# Patient Record
Sex: Female | Born: 2000 | Race: Black or African American | Hispanic: No | Marital: Single
Health system: Midwestern US, Community
[De-identification: ages and names within clinical notes are randomized; demographics above are authoritative.]

## PROBLEM LIST (undated history)

## (undated) DIAGNOSIS — O039 Complete or unspecified spontaneous abortion without complication: Secondary | ICD-10-CM

## (undated) HISTORY — DX: Complete or unspecified spontaneous abortion without complication: O03.9

## (undated) HISTORY — PX: MOUTH SURGERY: SHX715

---
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
## 2018-07-17 ENCOUNTER — Inpatient Hospital Stay
Admit: 2018-07-17 | Discharge: 2018-07-17 | Disposition: A | Discharge status: Home / Self Care | Payer: Self-pay | Attending: Emergency Medicine

## 2018-07-17 DIAGNOSIS — N760 Acute vaginitis: Secondary | ICD-10-CM

## 2018-07-17 LAB — URINALYSIS W/ RFLX MICROSCOPIC
Bilirubin, Urine: NEGATIVE
Bilirubin: NEGATIVE
Blood, Urine: NEGATIVE
Blood: NEGATIVE
Glucose, Ur: NEGATIVE mg/dL
Glucose: NEGATIVE mg/dL
Nitrite, Urine: NEGATIVE
Nitrites: NEGATIVE
Protein, UA: NEGATIVE mg/dL
Protein: NEGATIVE mg/dL
Specific Gravity, UA: 1.03 (ref 1.005–1.030)
Specific gravity: 1.03 (ref 1.005–1.030)
Urobilinogen, UA, POCT: 1 EU/dL (ref 0.2–1.0)
Urobilinogen: 1 EU/dL (ref 0.2–1.0)
pH (UA): 8 (ref 5.0–8.0)
pH, UA: 8 (ref 5.0–8.0)

## 2018-07-17 LAB — HCG URINE, QL
HCG urine, QL: NEGATIVE
Pregnancy Test(Urn): NEGATIVE

## 2018-07-17 LAB — WET PREP
Wet Prep: NONE SEEN
Wet Prep: NONE SEEN
Wet prep: NONE SEEN
Wet prep: NONE SEEN

## 2018-07-17 LAB — URINE MICROSCOPIC ONLY
BACTERIA, URINE: NEGATIVE /hpf
Bacteria: NEGATIVE /hpf
RBC, UA: NEGATIVE /hpf (ref 0–5)
RBC: NEGATIVE /hpf (ref 0–5)
WBC, UA: 0 /hpf (ref 0–4)
WBC: 0 /hpf (ref 0–4)

## 2018-07-17 MED ORDER — METRONIDAZOLE 500 MG TAB
500 mg | ORAL_TABLET | Freq: Two times a day (BID) | ORAL | 0 refills | Status: DC
Start: 2018-07-17 — End: 2018-07-17

## 2018-07-17 MED ORDER — METRONIDAZOLE 500 MG TAB
500 mg | ORAL_TABLET | Freq: Two times a day (BID) | ORAL | 0 refills | Status: AC
Start: 2018-07-17 — End: 2018-07-24

## 2018-07-17 NOTE — ED Provider Notes (Signed)
ED Provider Notes by Clayton BiblesProferes, Sunny A, PA-C at 07/17/18 1402                Author: Clayton BiblesProferes, Sunny A, PA-C  Service: Emergency Medicine  Author Type: Physician Assistant       Filed: 07/17/18 1457  Date of Service: 07/17/18 1402  Status: Attested           Editor: Proferes, Sheryn BisonSunny A, PA-C (Physician Assistant)  Cosigner: Reuel DerbyWentzel, Carl F, MD at 07/17/18 1505          Attestation signed by Reuel DerbyWentzel, Carl F, MD at 07/17/18 1505          I was personally available for consultation in the emergency department. I have reviewed the chart prior to the patient's discharge and agree with  the documentation [facility] by [facility], including the assessment, treatment plan, and disposition.                                    EMERGENCY DEPARTMENT HISTORY AND PHYSICAL EXAM      2:02 PM         Date: 07/17/2018   Patient Name: Anita Marsh        History of Presenting Illness          Chief Complaint       Patient presents with        ?  Other             vaginal odor              History Provided By: Patient and Patient's Mother      Chief Complaint: vaginal odor          Additional History (Context): Anita JordanChianti Lashway  is a 17 y.o. female with  No significant past medical history who presents with vaginal odor but no discharge.  Odor has been going on for the past week.  She is not having any pelvic pain.  She denies any dysuria or hematuria.   Last menstrual period was within the week.  No change in discharge.  No fevers.  She has been trying natural treatments to get rid of the odor such as baths with tea tree oil and tampon soaked in tea tree oil as well.      PCP: UNKNOWN              Past History        Past Medical History:   History reviewed. No pertinent past medical history.      Past Surgical History:   History reviewed. No pertinent surgical history.      Family History:   History reviewed. No pertinent family history.      Social History:     Social History          Tobacco Use         ?  Smoking status:  Former  Smoker       Substance Use Topics         ?  Alcohol use:  Yes             Comment: social         ?  Drug use:  Never           Allergies:   No Known Allergies           Review of Systems  Review of Systems    Constitutional: Negative for fever.    HENT: Negative for facial swelling.     Eyes: Negative for visual disturbance.    Respiratory: Negative for shortness of breath.     Cardiovascular: Negative for chest pain.    Gastrointestinal: Negative for abdominal pain.    Genitourinary: Negative for dysuria and pelvic pain.         Vaginal odor     Musculoskeletal: Negative for neck pain.    Skin: Negative for rash.    Neurological: Negative for dizziness.    Psychiatric/Behavioral: Negative for confusion.    All other systems reviewed and are negative.              Physical Exam        Visit Vitals      BP  107/61 (BP 1 Location: Left arm, BP Patient Position: Sitting)     Pulse  97     Temp  98.8 ??F (37.1 ??C)     Resp  14     Ht  5\' 9"  (1.753 m)     Wt  58.5 kg (129 lb)     LMP  07/13/2018     SpO2  98%        BMI  19.05 kg/m??              Physical Exam    Constitutional: She is oriented to person, place, and time. She appears well-developed and well-nourished. No distress.    HENT:    Head: Normocephalic and atraumatic.    Eyes: Conjunctivae are normal.    Neck: Normal range of motion.    Cardiovascular: Normal rate and regular rhythm.    Pulmonary/Chest: Effort normal.    Abdominal: She exhibits no distension. There is no tenderness.   Musculoskeletal: Normal range of motion.   Neurological: She is alert and oriented  to person, place, and time.    Skin: Skin is warm and dry. She is not diaphoretic.   Psychiatric: She has a normal mood and affect.    Nursing note and vitals reviewed.              Diagnostic Study Results        Labs -     Recent Results (from the past 12 hour(s))     WET PREP          Collection Time: 07/17/18  2:40 PM         Result  Value  Ref Range            Special Requests:  NO  SPECIAL REQUESTS          Wet prep  FEW   CLUE CELLS PRESENT             Wet prep  NO TRICHOMONAS SEEN          Wet prep  NO YEAST SEEN          URINALYSIS W/ RFLX MICROSCOPIC          Collection Time: 07/17/18  2:42 PM         Result  Value  Ref Range            Color  YELLOW          Appearance  CLEAR          Specific gravity  1.030  1.005 - 1.030         pH (UA)  8.0  5.0 - 8.0         Protein  NEGATIVE   NEG mg/dL       Glucose  NEGATIVE   NEG mg/dL       Ketone  TRACE (A)  NEG mg/dL       Bilirubin  NEGATIVE   NEG         Blood  NEGATIVE   NEG         Urobilinogen  1.0  0.2 - 1.0 EU/dL       Nitrites  NEGATIVE   NEG         Leukocyte Esterase  TRACE (A)  NEG         HCG URINE, QL          Collection Time: 07/17/18  2:42 PM         Result  Value  Ref Range            HCG urine, QL  NEGATIVE   NEG             Radiologic Studies -      No orders to display                Medical Decision Making     I am the first provider for this patient.      I reviewed the vital signs, available nursing notes, past medical history, past surgical history, family history and social history.      Vital Signs-Reviewed the patient's vital signs.         Records Reviewed: Nursing Notes  (Time of Review: 2:02 PM)      ED Course: Progress Notes, Reevaluation, and Consults:         Provider Notes (Medical Decision Making): MDM   Number of Diagnoses or Management Options   BV (bacterial vaginosis):    Diagnosis management comments: 17yo F c/o vaginal odor but no pain or discharge.  Patient is comfortable doing self swabs.  No pain to suggest PID.        2:57 PM   BV treated.  STI test pending.   Discussed treatment plan, return precautions, symptomatic relief, and expected time to improvement.  All questions answered. Patient is stable for discharge and outpatient management.                          Diagnosis        Clinical Impression:       1.  BV (bacterial vaginosis)            Disposition: Discharged           Follow-up  Information               Follow up With  Specialties  Details  Why  Contact Info              [facility]  Emergency Medicine  In 3 days  If symptoms do not improve, Immediately if symptoms worsen  [address] [place] [address]   [phone]                   Patient's Medications       Start Taking           METRONIDAZOLE (FLAGYL) 500 MG TABLET     Take 1 Tab by mouth two (2) times a day for 7 days.  Continue Taking          No medications on file       These Medications have changed          No medications on file       Stop Taking          No medications on file        _______________________________      Attestations:   Scribe Attestation      Sunny A Proferes, PA-C acting as a Neurosurgeon for and in the presence of [employer], PA-C       July 17, 2018 at 2:57 PM         Provider Attestation:       I personally performed the services described in the documentation, reviewed the documentation, as recorded by the scribe in my presence, and it accurately and completely records my words and actions.  July 17, 2018 at 2:57 PM - Barton Fanny, PA-C   _______________________________

## 2018-07-17 NOTE — ED Notes (Signed)
C/O vaginal odor x 1 week. Denies vaginal discharge or pain.

## 2018-07-17 NOTE — ED Notes (Signed)
Patient armband removed and shreddedI have reviewed discharge instructions with the patient.  The patient verbalized understanding. rx x 1 given;

## 2018-07-17 NOTE — ED Provider Notes (Signed)
EMERGENCY DEPARTMENT HISTORY AND PHYSICAL EXAM    2:02 PM      Date: 07/17/2018  Patient Name: Anita Marsh    History of Presenting Illness     Chief Complaint   Patient presents with   ??? Other     vaginal odor         History Provided By: Patient and Patient's Mother    Chief Complaint: vaginal odor       Additional History (Context): Anita JordanChianti Kampe is a 17 y.o. female with No significant past medical history who presents with vaginal odor but no discharge.  Odor has been going on for the past week.  She is not having any pelvic pain.  She denies any dysuria or hematuria.  Last menstrual period was within the week.  No change in discharge.  No fevers.  She has been trying natural treatments to get rid of the odor such as baths with tea tree oil and tampon soaked in tea tree oil as well.    PCP: UNKNOWN        Past History     Past Medical History:  History reviewed. No pertinent past medical history.    Past Surgical History:  History reviewed. No pertinent surgical history.    Family History:  History reviewed. No pertinent family history.    Social History:  Social History     Tobacco Use   ??? Smoking status: Former Smoker   Substance Use Topics   ??? Alcohol use: Yes     Comment: social   ??? Drug use: Never       Allergies:  No Known Allergies      Review of Systems       Review of Systems   Constitutional: Negative for fever.   HENT: Negative for facial swelling.    Eyes: Negative for visual disturbance.   Respiratory: Negative for shortness of breath.    Cardiovascular: Negative for chest pain.   Gastrointestinal: Negative for abdominal pain.   Genitourinary: Negative for dysuria and pelvic pain.        Vaginal odor    Musculoskeletal: Negative for neck pain.   Skin: Negative for rash.   Neurological: Negative for dizziness.   Psychiatric/Behavioral: Negative for confusion.   All other systems reviewed and are negative.        Physical Exam     Visit Vitals   BP 107/61 (BP 1 Location: Left arm, BP Patient Position: Sitting)   Pulse 97   Temp 98.8 ??F (37.1 ??C)   Resp 14   Ht 5\' 9"  (1.753 m)   Wt 58.5 kg (129 lb)   LMP 07/13/2018   SpO2 98%   BMI 19.05 kg/m??         Physical Exam   Constitutional: She is oriented to person, place, and time. She appears well-developed and well-nourished. No distress.   HENT:   Head: Normocephalic and atraumatic.   Eyes: Conjunctivae are normal.   Neck: Normal range of motion.   Cardiovascular: Normal rate and regular rhythm.   Pulmonary/Chest: Effort normal.   Abdominal: She exhibits no distension. There is no tenderness.   Musculoskeletal: Normal range of motion.   Neurological: She is alert and oriented to person, place, and time.   Skin: Skin is warm and dry. She is not diaphoretic.   Psychiatric: She has a normal mood and affect.   Nursing note and vitals reviewed.        Diagnostic Study Results  Labs -  Recent Results (from the past 12 hour(s))   WET PREP    Collection Time: 07/17/18  2:40 PM   Result Value Ref Range    Special Requests: NO SPECIAL REQUESTS      Wet prep FEW  CLUE CELLS PRESENT        Wet prep NO TRICHOMONAS SEEN      Wet prep NO YEAST SEEN     URINALYSIS W/ RFLX MICROSCOPIC    Collection Time: 07/17/18  2:42 PM   Result Value Ref Range    Color YELLOW      Appearance CLEAR      Specific gravity 1.030 1.005 - 1.030      pH (UA) 8.0 5.0 - 8.0      Protein NEGATIVE  NEG mg/dL    Glucose NEGATIVE  NEG mg/dL    Ketone TRACE (A) NEG mg/dL    Bilirubin NEGATIVE  NEG      Blood NEGATIVE  NEG      Urobilinogen 1.0 0.2 - 1.0 EU/dL    Nitrites NEGATIVE  NEG      Leukocyte Esterase TRACE (A) NEG     HCG URINE, QL    Collection Time: 07/17/18  2:42 PM   Result Value Ref Range    HCG urine, QL NEGATIVE  NEG         Radiologic Studies -   No orders to display         Medical Decision Making   I am the first provider for this patient.    I reviewed the vital signs, available nursing notes, past medical history,  past surgical history, family history and social history.    Vital Signs-Reviewed the patient's vital signs.      Records Reviewed: Nursing Notes (Time of Review: 2:02 PM)    ED Course: Progress Notes, Reevaluation, and Consults:      Provider Notes (Medical Decision Making): MDM  Number of Diagnoses or Management Options  BV (bacterial vaginosis):   Diagnosis management comments: 17yo F c/o vaginal odor but no pain or discharge.  Patient is comfortable doing self swabs.  No pain to suggest PID.      2:57 PM  BV treated.  STI test pending.   Discussed treatment plan, return precautions, symptomatic relief, and expected time to improvement.  All questions answered. Patient is stable for discharge and outpatient management.                 Diagnosis     Clinical Impression:   1. BV (bacterial vaginosis)        Disposition: Discharged      Follow-up Information     Follow up With Specialties Details Why Contact Info    [facility] Emergency Medicine In 3 days If symptoms do not improve, Immediately if symptoms worsen [address] [place] [address]  [phone]           Patient's Medications   Start Taking    METRONIDAZOLE (FLAGYL) 500 MG TABLET    Take 1 Tab by mouth two (2) times a day for 7 days.   Continue Taking    No medications on file   These Medications have changed    No medications on file   Stop Taking    No medications on file     _______________________________    Attestations:  Scribe Attestation     Arnoldo Hildreth A Aahana Elza, PA-C acting as a Neurosurgeon for  and in the presence of Barton FannySunny Brantley Naser, PA-C      July 17, 2018 at 2:57 PM       Provider Attestation:      I personally performed the services described in the documentation, reviewed the documentation, as recorded by the scribe in my presence, and it accurately and completely records my words and actions. July 17, 2018 at 2:57 PM - Barton FannySunny Jeramey Lanuza, PA-C  _______________________________

## 2018-07-17 NOTE — ED Triage Notes (Signed)
C/O vaginal odor x 1 week. Denies vaginal discharge or pain.

## 2018-07-19 LAB — CHLAMYDIA/NEISSERIA AMPLIFICATION
Chlamydia amplification: POSITIVE — AB
N. gonorrhoeae amplification: NEGATIVE

## 2018-07-19 LAB — C.TRACHOMATIS N.GONORRHOEAE DNA
Chlamydia trachomatis, NAA: POSITIVE — AB
Neisseria Gonorrhoeae, NAA: NEGATIVE

## 2019-07-03 ENCOUNTER — Other Ambulatory Visit: Payer: Self-pay

## 2019-07-03 ENCOUNTER — Ambulatory Visit: Payer: Medicaid Other | Admitting: Nurse Practitioner

## 2019-07-03 VITALS — BP 96/59 | Ht 68.5 in | Wt 128.2 lb

## 2019-07-03 DIAGNOSIS — Z113 Encounter for screening for infections with a predominantly sexual mode of transmission: Secondary | ICD-10-CM | POA: Diagnosis not present

## 2019-07-03 LAB — WET PREP FOR TRICH, YEAST, CLUE
Trichomonas Exam: NEGATIVE
Yeast Exam: NEGATIVE

## 2019-07-03 NOTE — Progress Notes (Signed)
     STI clinic/screening visit  Subjective:  Stephany Poorman is a 18 y.o. female being seen today for an STI screening visit. The patient reports they do not have symptoms.  Patient has the following medical conditions:  There are no active problems to display for this patient.    Chief Complaint  Patient presents with  . Gynecologic Exam  . SEXUALLY TRANSMITTED DISEASE    all testing denies symptoms    Client into clinic for STD testing Denies any symptoms at this time Admits to Chlamydia in 2016 Never tested for HIV Last sex - 01/2019 - sometimes condom use Admits to Dental surgery in 04/2011 Pap due 11/2019 LMP - 06/02/2019 Denies current BCM - declines BCM at this time Denies any significant medical history  Patient reports - no symptoms at this time See flowsheet for further details and programmatic requirements.    The following portions of the patient's history were reviewed and updated as appropriate: allergies, current medications, past medical history, past social history, past surgical history and problem list.  Objective:   Vitals:   07/03/19 1008  BP: (!) 96/59  Weight: 128 lb 3.2 oz (58.2 kg)  Height: 5' 8.5" (1.74 m)    Physical Exam Vitals signs reviewed.  Constitutional:      Appearance: Normal appearance. She is well-developed and normal weight.  Pulmonary:     Effort: Pulmonary effort is normal.  Skin:    General: Skin is warm and dry.  Neurological:     Mental Status: She is alert.  Psychiatric:        Behavior: Behavior is cooperative.       Assessment and Plan:  Kyndal Heringer is a 18 y.o. female presenting to the [facility] Department for STI screening  1. Screening examination for STD (sexually transmitted disease) Await tests results  - WET PREP FOR [place], YEAST, CLUE - treat wet mount per standing order - Chlamydia/Gonococcus/Trichomonas, NAA  - HIV [facility] - Syphilis Serology, [facility]  Client verbalizes  understanding and is in agreement with plan of care    Return in about 6 months (around 12/31/2019) for Yearly physical exam to include pap.  No future appointments.  Berniece Andreas, NP

## 2019-07-03 NOTE — Progress Notes (Signed)
Here today for FP appt. Not interested in birth control at this time. Wants STD testing. Declines bloodwork. Hal Morales, RN  Wet Prep results reviewed. Per standing orders no treatment indicated. Hal Morales, RN

## 2019-07-17 ENCOUNTER — Other Ambulatory Visit: Payer: Self-pay | Admitting: Family Medicine

## 2019-07-17 NOTE — Telephone Encounter (Signed)
patient wants a refil on a pill that is for yeast infections. Says she does not remember the name but was told about it the day of her visit.

## 2019-07-17 NOTE — Telephone Encounter (Signed)
Returned patient phone call. Patient states she would like a "pill for yeast." Patient states when she was here last the provider told her to expect a call about her TR and can get a pill then if needed. Patient states "where I'm from in [place] they just give me a pill to keep the yeast down." RN counseled patient that wet prep results from  07/03/2019 STD appt test results were negative. Patient requesting to schedule another appt to be tested for yeast. STD appt scheduled for 07/23/2019 @ 3:20. Instructed patient to arrive at 3:00 for check in. Hal Morales, RN

## 2019-07-23 ENCOUNTER — Ambulatory Visit: Payer: Medicaid Other

## 2019-07-25 ENCOUNTER — Ambulatory Visit: Payer: Medicaid Other

## 2019-10-23 ENCOUNTER — Ambulatory Visit (LOCAL_COMMUNITY_HEALTH_CENTER): Payer: Medicaid Other | Admitting: Family Medicine

## 2019-10-23 ENCOUNTER — Encounter: Payer: Self-pay | Admitting: Family Medicine

## 2019-10-23 ENCOUNTER — Other Ambulatory Visit: Payer: Self-pay

## 2019-10-23 VITALS — BP 103/73 | Ht 69.0 in | Wt 121.0 lb

## 2019-10-23 DIAGNOSIS — N939 Abnormal uterine and vaginal bleeding, unspecified: Secondary | ICD-10-CM | POA: Diagnosis not present

## 2019-10-23 DIAGNOSIS — Z3009 Encounter for other general counseling and advice on contraception: Secondary | ICD-10-CM | POA: Diagnosis not present

## 2019-10-23 LAB — PREGNANCY, URINE: Preg Test, Ur: NEGATIVE

## 2019-10-23 NOTE — Progress Notes (Signed)
Here for "check up".  Declines HIV/RPR and BC method.  Reports passed blood clots last week. Last Depo was in 2016. LMP unknown. Aileen Fass, RN

## 2019-10-23 NOTE — Progress Notes (Signed)
  Family Planning Visit- Repeat Yearly Visit  Subjective:  Allison Burke is a 18 y.o. being seen today for an well woman visit and to discuss family planning options. Her boyfriend is present in the room.  She is currently using none for pregnancy prevention. Patient reports she does if she or her partner wants a pregnancy in the next year. Patient  does not have a problem list on file.  Chief Complaint  Patient presents with  . "check up"    Patient reports 2 days ago (Sunday) she was in the shower, felt dizzy. She sat on the toilet and passed small blood clots from vagina and threw up food. Prior to this she had pizza and 1/2 bottle of wine. After this she rested, has been taking it slow, less appetite but feeling better. This morning she had another episode of 1 blood clot in toilet. Denies abdominal pain, no fever.   LMP around Sept 30, skipped October period. Has not taken home pregnancy test.  States she is trying to get pregnant.   Declines STI screening today.   Does the patient desire a pregnancy in the next year? ([facility] flowsheet) YES  See flowsheet for other program required questions.   Body mass index is 17.87 kg/m. - Patient is eligible for diabetes screening based on BMI and age >11?  no HA1C ordered? not applicable  Patient reports 1 of partners in last year. Desires STI screening?  No - declines today  Does the patient have a current or past history of drug use? No   No components found for: HCV]   Health Maintenance Due  Topic Date Due  . CHLAMYDIA SCREENING  12/28/2013  . HIV Screening  12/28/2013  . TETANUS/TDAP  12/28/2017  . INFLUENZA VACCINE  06/30/2019    ROS  The following portions of the patient's history were reviewed and updated as appropriate: allergies, current medications, past family history, past medical history, past social history, past surgical history and problem list. Problem list updated.  Objective:   Vitals:   10/23/19 1019  BP:  103/73  Weight: 121 lb (54.9 kg)  Height: 5\' 9"  (1.753 m)    Physical Exam  Gen: well appearing, NAD, converses easily HEENT: no scleral icterus Lung: Normal WOB Ext: warm well perfused, no edema  Pt declines more extensive physical exam.   Urine pregnancy test: negative   Assessment and Plan:  Rynn Markiewicz is a 18 y.o. female presenting to the [facility] Department for an initial well woman exam/family planning visit    1. Family planning services Pt is hoping to get pregnant, declines contraception today. Preconception counseling given today. Continue prenatal vitamins, exercise and healthy diet. Encouraged cessation of alcohol, tobacco, and drugs. Declines STI screening today.  2. Vaginal bleeding Urine preg negative today, she declines pelvic exam. We discussed this may be the start of her period. Advised that if symptoms worsen to heavy bleeding and/or abdominal pain to go to ER. She and her boyfriend are comfortable with this plan. Also given handout of local primary care clinics in case more extensive gynecology workup needed in future.  - Pregnancy, urine   Return if symptoms worsen or fail to improve.  No future appointments.  Kandee Keen, PA-C

## 2019-10-24 ENCOUNTER — Encounter: Payer: Medicaid Other | Admitting: Obstetrics & Gynecology

## 2019-12-11 ENCOUNTER — Other Ambulatory Visit: Payer: Self-pay

## 2019-12-11 ENCOUNTER — Ambulatory Visit (LOCAL_COMMUNITY_HEALTH_CENTER): Payer: Medicaid Other

## 2019-12-11 VITALS — BP 102/69 | Ht 69.0 in | Wt 121.0 lb

## 2019-12-11 DIAGNOSIS — Z3201 Encounter for pregnancy test, result positive: Secondary | ICD-10-CM | POA: Diagnosis not present

## 2019-12-11 MED ORDER — PRENATAL VITAMINS 28-0.8 MG PO TABS: 28.0000 mg | ORAL_TABLET | Freq: Every day | ORAL | 0 refills | Status: AC

## 2019-12-11 NOTE — Progress Notes (Signed)
SAB 4 months ago per patient.  Plans PNC at [facility]. No [facility].  Richmond Campbell, RN

## 2019-12-12 LAB — PREGNANCY, URINE: Preg Test, Ur: POSITIVE — AB

## 2019-12-20 ENCOUNTER — Other Ambulatory Visit: Payer: Self-pay

## 2019-12-20 ENCOUNTER — Other Ambulatory Visit (HOSPITAL_COMMUNITY)
Admission: RE | Admit: 2019-12-20 | Discharge: 2019-12-20 | Disposition: A | Discharge status: Home / Self Care | Payer: Medicaid Other | Source: Ambulatory Visit | Attending: Advanced Practice Midwife | Admitting: Advanced Practice Midwife

## 2019-12-20 ENCOUNTER — Ambulatory Visit (INDEPENDENT_AMBULATORY_CARE_PROVIDER_SITE_OTHER): Payer: Medicaid Other | Admitting: Advanced Practice Midwife

## 2019-12-20 ENCOUNTER — Encounter: Payer: Self-pay | Admitting: Advanced Practice Midwife

## 2019-12-20 VITALS — BP 114/70 | Wt 122.0 lb

## 2019-12-20 DIAGNOSIS — Z124 Encounter for screening for malignant neoplasm of cervix: Secondary | ICD-10-CM | POA: Diagnosis present

## 2019-12-20 DIAGNOSIS — Z113 Encounter for screening for infections with a predominantly sexual mode of transmission: Secondary | ICD-10-CM

## 2019-12-20 DIAGNOSIS — Z3481 Encounter for supervision of other normal pregnancy, first trimester: Secondary | ICD-10-CM

## 2019-12-20 DIAGNOSIS — Z3A08 8 weeks gestation of pregnancy: Secondary | ICD-10-CM

## 2019-12-20 DIAGNOSIS — Z3480 Encounter for supervision of other normal pregnancy, unspecified trimester: Secondary | ICD-10-CM | POA: Insufficient documentation

## 2019-12-20 NOTE — Patient Instructions (Signed)
Perinatal Depression When a woman feels excessive sadness, anger, or anxiety during pregnancy or during the first 12 months after she gives birth, she has a condition called perinatal depression. Depression can interfere with work, school, relationships, and other everyday activities. If it is not managed properly, it can also cause problems in the mother and her baby. Sometimes, perinatal depression is left untreated because symptoms are thought to be normal mood swings during and right after pregnancy. If you have symptoms of depression, it is important to talk with your health care provider. What are the causes? The exact cause of this condition is not known. Hormonal changes during and after pregnancy may play a role in causing perinatal depression. What increases the risk? You are more likely to develop this condition if:  You have a personal or family history of depression, anxiety, or mood disorders.  You experience a stressful life event during pregnancy, such as the death of a loved one.  You have a lot of regular life stress.  You do not have support from family members or loved ones, or you are in an abusive relationship. What are the signs or symptoms? Symptoms of this condition include:  Feeling sad or hopeless.  Feelings of guilt.  Feeling irritable or overwhelmed.  Changes in your appetite.  Lack of energy or motivation.  Sleep problems.  Difficulty concentrating or completing tasks.  Loss of interest in hobbies or relationships.  Headaches or stomach problems that do not go away. How is this diagnosed? This condition is diagnosed based on a physical exam and mental evaluation. In some cases, your health care provider may use a depression screening tool. These tools include a list of questions that can help a health care provider diagnose depression. Your health care provider may refer you to a mental health expert who specializes in depression. How is this  treated? This condition may be treated with:  Medicines. Your health care provider will only give you medicines that have been proven safe for pregnancy and breastfeeding.  Talk therapy with a mental health professional to help change your patterns of thinking (cognitive behavioral therapy).  Support groups.  Brain stimulation or light therapies.  Stress reduction therapies, such as mindfulness. Follow these instructions at home: Lifestyle  Do not use any products that contain nicotine or tobacco, such as cigarettes and e-cigarettes. If you need help quitting, ask your health care provider.  Do not use alcohol when you are pregnant. After your baby is born, limit alcohol intake to no more than 1 drink a day. One drink equals 12 oz of beer, 5 oz of wine, or 1 oz of hard liquor.  Consider joining a support group for new mothers. Ask your health care provider for recommendations.  Take good care of yourself. Make sure you: ? Get plenty of sleep. If you are having trouble sleeping, talk with your health care provider. ? Eat a healthy diet. This includes plenty of fruits and vegetables, whole grains, and lean proteins. ? Exercise regularly, as told by your health care provider. Ask your health care provider what exercises are safe for you. General instructions  Take over-the-counter and prescription medicines only as told by your health care provider.  Talk with your partner or family members about your feelings during pregnancy. Share any concerns or anxieties that you may have.  Ask for help with tasks or chores when you need it. Ask friends and family members to provide meals, watch your children, or help with   cleaning.  Keep all follow-up visits as told by your health care provider. This is important. Contact a health care provider if:  You (or people close to you) notice that you have any symptoms of depression.  You have depression and your symptoms get worse.  You  experience side effects from medicines, such as nausea or sleep problems. Get help right away if:  You feel like hurting yourself, your baby, or someone else. If you ever feel like you may hurt yourself or others, or have thoughts about taking your own life, get help right away. You can go to your nearest emergency department or call:  Your local emergency services (911 in the [place]  A suicide crisis helpline, such as the National Suicide Prevention Lifeline at 92804915731-(801)316-3749. This is open 24 hours a day. Summary  Perinatal depression is when a woman feels excessive sadness, anger, or anxiety during pregnancy or during the first 12 months after she gives birth.  If perinatal depression is not treated, it can lead to health problems for the mother and her baby.  This condition is treated with medicines, talk therapy, stress reduction therapies, or a combination of two or more treatments.  Talk with your partner or family members about your feelings. Do not be afraid to ask for help. This information is not intended to replace advice given to you by your health care provider. Make sure you discuss any questions you have with your health care provider. Document Revised: 05/02/2019 Document Reviewed: 01/12/2017 Elsevier Patient Education  2020 [employer] Genetic Testing During Pregnancy Genetic testing during pregnancy is also called prenatal genetic testing. This type of testing can determine if your baby is at risk of being born with a disorder caused by abnormal genes or chromosomes (genetic disorder). Chromosomes contain genes that control how your baby will develop in your womb. There are many different genetic disorders. Examples of genetic disorders that may be found through genetic testing include Down syndrome and cystic fibrosis. Gene changes (mutations) can be passed down through families. Genetic testing is offered to all women before or during pregnancy. You can choose whether to  have genetic testing. Why is genetic testing done? Genetic testing is done during pregnancy to find out whether your child is at risk for a genetic disorder. Having genetic testing allows you to:  Discuss your test results and options with a genetic counselor.  Prepare for a baby that may be born with a genetic disorder. Learning about the disorder ahead of time helps you be better prepared to manage it. Your health care providers can also be prepared in case your baby requires special care before or after birth.  Consider whether you want to continue with the pregnancy. In some cases, genetic testing may be done to learn about the traits a child will inherit. Types of genetic tests There are two basic types of genetic testing. Screening tests indicate whether your developing baby (fetus) is at higher risk for a genetic disorder. Diagnostic tests check actual fetal cells to diagnose a genetic disorder. Screening tests     Screening tests will not harm your baby. They are recommended for all pregnant women. Types of screening tests include:  Carrier screening. This test involves checking genes from both parents by testing their blood or saliva. The test checks to find out if the parents carry a genetic mutation that may be passed to a baby. In most cases, both parents must carry the mutation for a baby to be  at risk.  First trimester screening. This test combines a blood test with sound wave imaging of your baby (fetal ultrasound). This screening test checks for a risk of Down syndrome or other defects caused by having extra chromosomes. It also checks for defects of the heart, abdomen, or skeleton.  Second trimester screening also combines a blood test with a fetal ultrasound exam. It checks for a risk of genetic defects of the face, brain, spine, heart, or limbs.  Combined or sequential screening. This type of testing combines the results of first and second trimester screening. This type of  testing may be more accurate than first or second trimester screening alone.  Cell-free DNA testing. This is a blood test that detects cells released by the placenta that get into the mother's blood. It can be used to check for a risk of Down syndrome, other extra chromosome syndromes, and disorders caused by abnormal numbers of sex chromosomes. This test can be done any time after 10 weeks of pregnancy.  Diagnostic tests Diagnostic tests carry slight risks of problems, including bleeding, infection, and loss of the pregnancy. These tests are done only if your baby is at risk for a genetic disorder. You may meet with a genetic counselor to discuss the risks and benefits before having diagnostic tests. Examples of diagnostic tests include:  Chorionic villus sampling (CVS). This involves a procedure to remove and test a sample of cells taken from the placenta. The procedure may be done between 10 and 12 weeks of pregnancy.  Amniocentesis. This involves a procedure to remove and test a sample of fluid (amniotic fluid) and cells from the sac that surrounds the developing baby. The procedure may be done between 15 and 20 weeks of pregnancy. What do the results mean? For a screening test:  If the results are negative, it often means that your child is not at higher risk. There is still a slight chance your child could have a genetic disorder.  If the results are positive, it does not mean your child will have a genetic disorder. It may mean that your child has a higher-than-normal risk for a genetic disorder. In that case, you may want to talk with a genetic counselor about whether you should have diagnostic genetic tests. For a diagnostic test:  If the result is negative, it is unlikely that your child will have a genetic disorder.  If the test is positive for a genetic disorder, it is likely that your child will have the disorder. The test may not tell how severe the disorder will be. Talk with your  health care provider about your options. Questions to ask your health care provider Before talking to your health care provider about genetic testing, find out if there is a history of genetic disorders in your family. It may also help to know your family's ethnic origins. Then ask your health care provider the following questions:  Is my baby at risk for a genetic disorder?  What are the benefits of having genetic screening?  What tests are best for me and my baby?  What are the risks of each test?  If I get a positive result on a screening test, what is the next step?  Should I meet with a genetic counselor before having a diagnostic test?  Should my partner or other members of my family be tested?  How much do the tests cost? Will my insurance cover the testing? Summary  Genetic testing is done during pregnancy to  find out whether your child is at risk for a genetic disorder.  Genetic testing is offered to all women before or during pregnancy. You can choose whether to have genetic testing.  There are two basic types of genetic testing. Screening tests indicate whether your developing baby (fetus) is at higher risk for a genetic disorder. Diagnostic tests check actual fetal cells to diagnose a genetic disorder.  If a diagnostic genetic test is positive, talk with your health care provider about your options. This information is not intended to replace advice given to you by your health care provider. Make sure you discuss any questions you have with your health care provider. Document Revised: 03/08/2019 Document Reviewed: 01/30/2018 Elsevier Patient Education  2020 [employer]. Exercise During Pregnancy Exercise is an important part of being healthy for people of all ages. Exercise improves the function of your heart and lungs and helps you maintain strength, flexibility, and a healthy body weight. Exercise also boosts energy levels and elevates mood. Most women should exercise  regularly during pregnancy. In rare cases, women with certain medical conditions or complications may be asked to limit or avoid exercise during pregnancy. How does this affect me? Along with maintaining general strength and flexibility, exercising during pregnancy can help:  Keep strength in muscles that are used during labor and childbirth.  Decrease low back pain.  Reduce symptoms of depression.  Control weight gain during pregnancy.  Reduce the risk of needing insulin if you develop diabetes during pregnancy.  Decrease the risk of cesarean delivery.  Speed up your recovery after giving birth. How does this affect my baby? Exercise can help you have a healthy pregnancy. Exercise does not cause premature birth. It will not cause your baby to weigh less at birth. What exercises can I do? Many exercises are safe for you to do during pregnancy. Do a variety of exercises that safely increase your heart and breathing rates and help you build and maintain muscle strength. Do exercises exactly as told by your health care provider. You may do these exercises:  Walking or hiking.  Swimming.  Water aerobics.  Riding a stationary bike.  Strength training.  Modified yoga or Pilates. Tell your instructor that you are pregnant. Avoid overstretching, and avoid lying on your back for long periods of time.  Running or jogging. Only choose this type of exercise if you: ? Ran or jogged regularly before your pregnancy. ? Can run or jog and still talk in complete sentences. What exercises should I avoid? Depending on your level of fitness and whether you exercised regularly before your pregnancy, you may be told to limit high-intensity exercise. You can tell that you are exercising at a high intensity if you are breathing much harder and faster and cannot hold a conversation while exercising. You must avoid:  Contact sports.  Activities that put you at risk for falling on or being hit in the  belly, such as downhill skiing, water skiing, surfing, rock climbing, cycling, gymnastics, and horseback riding.  Scuba diving.  Skydiving.  Yoga or Pilates in a room that is heated to high temperatures.  Jogging or running, unless you ran or jogged regularly before your pregnancy. While jogging or running, you should always be able to talk in full sentences. Do not run or jog so fast that you are unable to have a conversation.  Do not exercise at more than 6,000 feet above sea level (high elevation) if you are not used to exercising at high  elevation. How do I exercise in a safe way?   Avoid overheating. Do not exercise in very high temperatures.  Wear loose-fitting, breathable clothes.  Avoid dehydration. Drink enough water before, during, and after exercise to keep your urine pale yellow.  Avoid overstretching. Because of hormone changes during pregnancy, it is easy to overstretch muscles, tendons, and ligaments during pregnancy.  Start slowly and ask your health care provider to recommend the types of exercise that are safe for you.  Do not exercise to lose weight. Follow these instructions at home:  Exercise on most days or all days of the week. Try to exercise for 30 minutes a day, 5 days a week, unless your health care provider tells you not to.  If you actively exercised before your pregnancy and you are healthy, your health care provider may tell you to continue to do moderate to high-intensity exercise.  If you are just starting to exercise or did not exercise much before your pregnancy, your health care provider may tell you to do low to moderate-intensity exercise. Questions to ask your health care provider  Is exercise safe for me?  What are signs that I should stop exercising?  Does my health condition mean that I should not exercise during pregnancy?  When should I avoid exercising during pregnancy? Stop exercising and contact a health care provider if: You  have any unusual symptoms, such as:  Mild contractions of the uterus or cramps in the abdomen.  Dizziness that does not go away when you rest. Stop exercising and get help right away if: You have any unusual symptoms, such as:  Sudden, severe pain in your low back or your belly.  Mild contractions of the uterus or cramps in the abdomen that do not improve with rest and drinking fluids.  Chest pain.  Bleeding or fluid leaking from your vagina.  Shortness of breath. These symptoms may represent a serious problem that is an emergency. Do not wait to see if the symptoms will go away. Get medical help right away. Call your local emergency services (911 in the [place] Do not drive yourself to the hospital. Summary  Most women should exercise regularly throughout pregnancy. In rare cases, women with certain medical conditions or complications may be asked to limit or avoid exercise during pregnancy.  Do not exercise to lose weight during pregnancy.  Your health care provider will tell you what level of physical activity is right for you.  Stop exercising and contact a health care provider if you have mild contractions of the uterus or cramps in the abdomen. Get help right away if these contractions or cramps do not improve with rest and drinking fluids.  Stop exercising and get help right away if you have sudden, severe pain in your low back or belly, chest pain, shortness of breath, or bleeding or leaking of fluid from your vagina. This information is not intended to replace advice given to you by your health care provider. Make sure you discuss any questions you have with your health care provider. Document Revised: 03/08/2019 Document Reviewed: 12/20/2018 Elsevier Patient Education  2020 [employer]. Eating Plan for Pregnant Women While you are pregnant, your body requires additional nutrition to help support your growing baby. You also have a higher need for some vitamins and minerals,  such as folic acid, calcium, iron, and vitamin D. Eating a healthy, well-balanced diet is very important for your health and your baby's health. Your need for extra calories varies for  the three 59-month segments of your pregnancy (trimesters). For most women, it is recommended to consume:  150 extra calories a day during the first trimester.  300 extra calories a day during the second trimester.  300 extra calories a day during the third trimester. What are tips for following this plan?   Do not try to lose weight or go on a diet during pregnancy.  Limit your overall intake of foods that have "empty calories." These are foods that have little nutritional value, such as sweets, desserts, candies, and sugar-sweetened beverages.  Eat a variety of foods (especially fruits and vegetables) to get a full range of vitamins and minerals.  Take a prenatal vitamin to help meet your additional vitamin and mineral needs during pregnancy, specifically for folic acid, iron, calcium, and vitamin D.  Remember to stay active. Ask your health care provider what types of exercise and activities are safe for you.  Practice good food safety and cleanliness. Wash your hands before you eat and after you prepare raw meat. Wash all fruits and vegetables well before peeling or eating. Taking these actions can help to prevent food-borne illnesses that can be very dangerous to your baby, such as listeriosis. Ask your health care provider for more information about listeriosis. What does 150 extra calories look like? Healthy options that provide 150 extra calories each day could be any of the following:  6-8 oz (170-230 g) of plain low-fat yogurt with  cup of berries.  1 apple with 2 teaspoons (11 g) of peanut butter.  Cut-up vegetables with  cup (60 g) of hummus.  8 oz (230 mL) or 1 cup of low-fat chocolate milk.  1 stick of string cheese with 1 medium orange.  1 peanut butter and jelly sandwich that is made  with one slice of whole-wheat bread and 1 tsp (5 g) of peanut butter. For 300 extra calories, you could eat two of those healthy options each day. What is a healthy amount of weight to gain? The right amount of weight gain for you is based on your BMI before you became pregnant. If your BMI:  Was less than 18 (underweight), you should gain 28-40 lb (13-18 kg).  Was 18-24.9 (normal), you should gain 25-35 lb (11-16 kg).  Was 25-29.9 (overweight), you should gain 15-25 lb (7-11 kg).  Was 30 or greater (obese), you should gain 11-20 lb (5-9 kg). What if I am having twins or multiples? Generally, if you are carrying twins or multiples:  You may need to eat 300-600 extra calories a day.  The recommended range for total weight gain is 25-54 lb (11-25 kg), depending on your BMI before pregnancy.  Talk with your health care provider to find out about nutritional needs, weight gain, and exercise that is right for you. What foods can I eat?  Fruits All fruits. Eat a variety of colors and types of fruit. Remember to wash your fruits well before peeling or eating. Vegetables All vegetables. Eat a variety of colors and types of vegetables. Remember to wash your vegetables well before peeling or eating. Grains All grains. Choose whole grains, such as whole-wheat bread, oatmeal, or brown rice. Meats and other protein foods Lean meats, including chicken, [place], fish, and lean cuts of beef, veal, or pork. If you eat fish or seafood, choose options that are higher in omega-3 fatty acids and lower in mercury, such as salmon, herring, mussels, trout, sardines, pollock, shrimp, crab, and lobster. Tofu. Tempeh. Beans. Eggs. Peanut  butter and other nut butters. Make sure that all meats, poultry, and eggs are cooked to food-safe temperatures or "well-done." Two or more servings of fish are recommended each week in order to get the most benefits from omega-3 fatty acids that are found in seafood. Choose fish  that are lower in mercury. You can find more information online:  PumpkinSearch.com.ee Dairy Pasteurized milk and milk alternatives (such as almond milk). Pasteurized yogurt and pasteurized cheese. Cottage cheese. Sour cream. Beverages Water. Juices that contain 100% fruit juice or vegetable juice. Caffeine-free teas and decaffeinated coffee. Drinks that contain caffeine are okay to drink, but it is better to avoid caffeine. Keep your total caffeine intake to less than 200 mg each day (which is 12 oz or 355 mL of coffee, tea, or soda) or the limit as told by your health care provider. Fats and oils Fats and oils are okay to include in moderation. Sweets and desserts Sweets and desserts are okay to include in moderation. Seasoning and other foods All pasteurized condiments. The items listed above may not be a complete list of foods and beverages you can eat. Contact a dietitian for more information. What foods are not recommended? Fruits Unpasteurized fruit juices. Vegetables Raw (unpasteurized) vegetable juices. Meats and other protein foods Lunch meats, bologna, hot dogs, or other deli meats. (If you must eat those meats, reheat them until they are steaming hot.) Refrigerated pat, meat spreads from a meat counter, smoked seafood that is found in the refrigerated section of a store. Raw or undercooked meats, poultry, and eggs. Raw fish, such as sushi or sashimi. Fish that have high mercury content, such as tilefish, shark, swordfish, and king mackerel. To learn more about mercury in fish, talk with your health care provider or look for online resources, such as:  PumpkinSearch.com.ee Dairy Raw (unpasteurized) milk and any foods that have raw milk in them. Soft cheeses, such as feta, queso blanco, queso fresco, Brie, Camembert cheeses, blue-veined cheeses, and Panela cheese (unless it is made with pasteurized milk, which must be stated on the label). Beverages Alcohol. Sugar-sweetened beverages, such as  sodas, teas, or energy drinks. Seasoning and other foods Homemade fermented foods and drinks, such as pickles, sauerkraut, or kombucha drinks. (Store-bought pasteurized versions of these are okay.) Salads that are made in a store or deli, such as ham salad, chicken salad, egg salad, tuna salad, and seafood salad. The items listed above may not be a complete list of foods and beverages you should avoid. Contact a dietitian for more information. Where to find more information To calculate the number of calories you need based on your height, weight, and activity level, you can use an online calculator such as:  PackageNews.is To calculate how much weight you should gain during pregnancy, you can use an online pregnancy weight gain calculator such as:  http://jones-berg.com/ Summary  While you are pregnant, your body requires additional nutrition to help support your growing baby.  Eat a variety of foods, especially fruits and vegetables to get a full range of vitamins and minerals.  Practice good food safety and cleanliness. Wash your hands before you eat and after you prepare raw meat. Wash all fruits and vegetables well before peeling or eating. Taking these actions can help to prevent food-borne illnesses, such as listeriosis, that can be very dangerous to your baby.  Do not eat raw meat or fish. Do not eat fish that have high mercury content, such as tilefish, shark, swordfish, and king mackerel. Do  not eat unpasteurized (raw) dairy.  Take a prenatal vitamin to help meet your additional vitamin and mineral needs during pregnancy, specifically for folic acid, iron, calcium, and vitamin D. This information is not intended to replace advice given to you by your health care provider. Make sure you discuss any questions you have with your health care provider. Document Revised: 04/05/2019 Document Reviewed: 08/12/2017 Elsevier Patient Education   2020 [employer]. Prenatal Care Prenatal care is health care during pregnancy. It helps you and your unborn baby (fetus) stay as healthy as possible. Prenatal care may be provided by a midwife, a family practice health care provider, or a childbirth and pregnancy specialist (obstetrician). How does this affect me? During pregnancy, you will be closely monitored for any new conditions that might develop. To lower your risk of pregnancy complications, you and your health care provider will talk about any underlying conditions you have. How does this affect my baby? Early and consistent prenatal care increases the chance that your baby will be healthy during pregnancy. Prenatal care lowers the risk that your baby will be:  Born early (prematurely).  Smaller than expected at birth (small for gestational age). What can I expect at the first prenatal care visit? Your first prenatal care visit will likely be the longest. You should schedule your first prenatal care visit as soon as you know that you are pregnant. Your first visit is a good time to talk about any questions or concerns you have about pregnancy. At your visit, you and your health care provider will talk about:  Your medical history, including: ? Any past pregnancies. ? Your family's medical history. ? The baby's father's medical history. ? Any long-term (chronic) health conditions you have and how you manage them. ? Any surgeries or procedures you have had. ? Any current over-the-counter or prescription medicines, herbs, or supplements you are taking.  Other factors that could pose a risk to your baby, including:  Your home setting and your stress levels, including: ? Exposure to abuse or violence. ? Household financial strain. ? Mental health conditions you have.  Your daily health habits, including diet and exercise. Your health care provider will also:  Measure your weight, height, and blood pressure.  Do a physical  exam, including a pelvic and breast exam.  Perform blood tests and urine tests to check for: ? Urinary tract infection. ? Sexually transmitted infections (STIs). ? Low iron levels in your blood (anemia). ? Blood type and certain proteins on red blood cells (Rh antibodies). ? Infections and immunity to viruses, such as hepatitis B and rubella. ? HIV (human immunodeficiency virus).  Do an ultrasound to confirm your baby's growth and development and to help predict your estimated due date (EDD). This ultrasound is done with a probe that is inserted into the vagina (transvaginal ultrasound).  Discuss your options for genetic screening.  Give you information about how to keep yourself and your baby healthy, including: ? Nutrition and taking vitamins. ? Physical activity. ? How to manage pregnancy symptoms such as nausea and vomiting (morning sickness). ? Infections and substances that may be harmful to your baby and how to avoid them. ? Food safety. ? Dental care. ? Working. ? Travel. ? Warning signs to watch for and when to call your health care provider. How often will I have prenatal care visits? After your first prenatal care visit, you will have regular visits throughout your pregnancy. The visit schedule is often as follows:  Up to  week 28 of pregnancy: once every 4 weeks.  28-36 weeks: once every 2 weeks.  After 36 weeks: every week until delivery. Some women may have visits more or less often depending on any underlying health conditions and the health of the baby. Keep all follow-up and prenatal care visits as told by your health care provider. This is important. What happens during routine prenatal care visits? Your health care provider will:  Measure your weight and blood pressure.  Check for fetal heart sounds.  Measure the height of your uterus in your abdomen (fundal height). This may be measured starting around week 20 of pregnancy.  Check the position of your  baby inside your uterus.  Ask questions about your diet, sleeping patterns, and whether you can feel the baby move.  Review warning signs to watch for and signs of labor.  Ask about any pregnancy symptoms you are having and how you are dealing with them. Symptoms may include: ? Headaches. ? Nausea and vomiting. ? Vaginal discharge. ? Swelling. ? Fatigue. ? Constipation. ? Any discomfort, including back or pelvic pain. Make a list of questions to ask your health care provider at your routine visits. What tests might I have during prenatal care visits? You may have blood, urine, and imaging tests throughout your pregnancy, such as:  Urine tests to check for glucose, protein, or signs of infection.  Glucose tests to check for a form of diabetes that can develop during pregnancy (gestational diabetes mellitus). This is usually done around week 24 of pregnancy.  An ultrasound to check your baby's growth and development and to check for birth defects. This is usually done around week 20 of pregnancy.  A test to check for group B strep (GBS) infection. This is usually done around week 36 of pregnancy.  Genetic testing. This may include blood or imaging tests, such as an ultrasound. Some genetic tests are done during the first trimester and some are done during the second trimester. What else can I expect during prenatal care visits? Your health care provider may recommend getting certain vaccines during pregnancy. These may include:  A yearly flu shot (annual influenza vaccine). This is especially important if you will be pregnant during flu season.  Tdap (tetanus, diphtheria, pertussis) vaccine. Getting this vaccine during pregnancy can protect your baby from whooping cough (pertussis) after birth. This vaccine may be recommended between weeks 27 and 36 of pregnancy. Later in your pregnancy, your health care provider may give you information about:  Childbirth and breastfeeding  classes.  Choosing a health care provider for your baby.  Umbilical cord banking.  Breastfeeding.  Birth control after your baby is born.  The hospital labor and delivery unit and how to tour it.  Registering at the hospital before you go into labor. Where to find more information  Office on Women's Health: LegalWarrants.gl  American Pregnancy Association: americanpregnancy.org  March of Dimes: marchofdimes.org Summary  Prenatal care helps you and your baby stay as healthy as possible during pregnancy.  Your first prenatal care visit will most likely be the longest.  You will have visits and tests throughout your pregnancy to monitor your health and your baby's health.  Bring a list of questions to your visits to ask your health care provider.  Make sure to keep all follow-up and prenatal care visits with your health care provider. This information is not intended to replace advice given to you by your health care provider. Make sure you discuss any questions  you have with your health care provider. Document Revised: 03/07/2019 Document Reviewed: 11/14/2017 Elsevier Patient Education  2020 [employer].    COVID-19 and Your Pregnancy FAQ  How can I prevent infection with COVID-19 during my pregnancy? Social distancing is key. Please limit any interactions in public. Try and work from home if possible. Frequently wash your hands after touching possibly contaminated surfaces. Avoid touching your face.  Minimize trips to the store. Consider online ordering when possible.   Should I wear a mask? YES. It is recommended by the CDC that all people wear a cloth mask or facial covering in public. You should wear a mask to your visits in the office. This will help reduce transmission as well as your risk or acquiring COVID-19. New studies are showing that even asymptomatic individuals can spread the virus from talking.   Where can I get a mask? [place] and the city of  [place] are partnering to provide masks to community members. You can pick up a mask from several locations. This website also has instructions about how to make a mask by sewing or without sewing by using a t-shirt or bandana.  https://www.[place].gov/i-want-to/learn-about/covid-19-information-and-updates/covid-19-face-mask-project  Studies have shown that if you were a tube or nylon stocking from pantyhose over a cloth mask it makes the cloth mask almost as effective as a N95 mask.  [employer].de  What are the symptoms of COVID-19? Fever (greater than 100.4 F), dry cough, shortness of breath.  Am I more at risk for COVID-19 since I am pregnant? There is not currently data showing that pregnant women are more adversely impacted by COVID-19 than the general population. However, we know that pregnant women tend to have worse respiratory complications from similar diseases such as the flu and SARS and for this reason should be considered an at-risk population.  What do I do if I am experiencing the symptoms of COVID-19? Testing is being limited because of test availability. If you are experiencing symptoms you should quarantine yourself, and the members of your family, for at least 2 weeks at home.   Please visit this website for more information: DiscoHelp.si.html  When should I go to the Emergency Room? Please go to the emergency room if you are experiencing ANY of these symptoms*:  1.    Difficulty breathing or shortness of breath 2.    Persistent pain or pressure in the chest 3.    Confusion or difficulty being aroused (or awakened) 4.    Bluish lips or face  *This list is not all inclusive. Please consult our office for any other symptoms that are severe or concerning.  What do I do if I  am having difficulty breathing? You should go to the Emergency Room for evaluation. At this time they have a tent set up for evaluating patients with COVID-19 symptoms.   How will my prenatal care be different because of the COVID-19 pandemic? It has been recommended to reduce the frequency of face-to-face visits and use resources such as telephone and virtual visits when possible. Using a scale, blood pressure machine and fetal doppler at home can further help reduce face-to-face visits. You will be provided with additional information on this topic.  We ask that you come to your visits alone to minimize potential exposures to  COVID-19.  How can I receive childbirth education? At this time in-person classes have been cancelled. You can register for online childbirth education, breastfeeding, and newborn care classes.  Please visit:  [email or web address] for more  information  How will my hospital birth experience be different? The hospital is currently limiting visitors. This means that while you are in labor you can only have one person at the hospital with you. Additional family members will not be allowed to wait in the building or outside your room. Your one support person can be the father of the baby, a relative, a doula, or a friend. Once one support person is designated that person will wear a band. This band cannot be shared with multiple people.  Nitrous Gas is not being offered for pain relief since the tubing and filter for the machine can not be sanitized in a way to guarantee prevention of transmission of COVID-19.  Nasal cannula use of oxygen for fetal indications has also been discontinued.  Currently a clear plastic sheet is being hung between mom and the delivering provider during pushing and delivery to help prevent transmission of COVID-19.      How long will I stay in the hospital for after giving birth? It is also recommended that discharge home be expedited during  the COVID-19 outbreak. This means staying for 1 day after a vaginal delivery and 2 days after a cesarean section. Patients who need to stay longer for medical reasons are allowed to do so, but the goal will be for expedited discharge home.   What if I have COVID-19 and I am in labor? We ask that you wear a mask while on labor and delivery. We will try and accommodate you being placed in a room that is capable of filtering the air. Please call ahead if you are in labor and on your way to the hospital. The phone number for labor and delivery at [facility] is [phone].  If I have COVID-19 when my baby is born how can I prevent my baby from contracting COVID-19? This is an issue that will have to be discussed on a case-by-case basis. Current recommendations suggest providing separate isolation rooms for both the mother and new infant as well as limiting visitors. However, there are practical challenges to this recommendation. The situation will assuredly change and decisions will be influenced by the desires of the mother and availability of space.  Some suggestions are the use of a curtain or physical barrier between mom and infant, hand hygiene, mom wearing a mask, or 6 feet of spacing between a mom and infant.   Can I breastfeed during the COVID-19 pandemic?   Yes, breastfeeding is encouraged.  Can I breastfeed if I have COVID-19? Yes. Covid-19 has not been found in breast milk. This means you cannot give COVID-19 to your child through breast milk. Breast feeding will also help pass antibodies to fight infection to your baby.   What precautions should I take when breastfeeding if I have COVID-19? If a mother and newborn do room-in and the mother wishes to feed at the breast, she should put on a facemask and practice hand hygiene before each feeding.  What precautions should I take when pumping if I have COVID-19? Prior to expressing breast milk, mothers should practice  hand hygiene. After each pumping session, all parts that come into contact with breast milk should be thoroughly washed and the entire pump should be appropriately disinfected per the manufacturer's instructions. This expressed breast milk should be fed to the newborn by a healthy caregiver.  What if I am pregnant and work in healthcare? Based on limited data regarding COVID-19 and pregnancy, ACOG currently does  not propose creating additional restrictions on pregnant health care personnel because of COVID-19 alone. Pregnant women do not appear to be at higher risk of severe disease related to COVID-19. Pregnant health care personnel should follow CDC risk assessment and infection control guidelines for health care personnel exposed to patients with suspected or confirmed COVID-19. Adherence to recommended infection prevention and control practices is an important part of protecting all health care personnel in health care settings.    Information on COVID-19 in pregnancy is very limited; however, facilities may want to consider limiting exposure of pregnant health care personnel to patients with confirmed or suspected COVID-19 infection, especially during higher-risk procedures (eg, aerosol-generating procedures), if feasible, based on staffing availability.

## 2019-12-20 NOTE — Progress Notes (Signed)
NOB today. LMP 10/25/2019

## 2019-12-20 NOTE — Progress Notes (Signed)
New Obstetric Patient H&P    Chief Complaint: "Desires prenatal care"   History of Present Illness: Patient is a 19 y.o. G2P0010 Not Hispanic or Latino female, presents with amenorrhea and positive home pregnancy test. Patient's last menstrual period was 10/25/2019- approximate within days and based on her  LMP, her EDD is Estimated Date of Delivery: 07/31/20 and her EGA is [redacted]w[redacted]d. Cycles are 3-4 days, regular, and occur approximately every : 28 days. Her first PAP smear is today (she will be 19 in 9 days).   She had a urine pregnancy test which was positive 2 or 3 week(s)  ago. Her last menstrual period was normal and lasted for  3 or 4 day(s). Since her LMP she claims she has experienced breast tenderness, fatigue, nausea, occasional vomiting. She denies vaginal bleeding. Her past medical history is noncontributory. Her prior pregnancies are notable for SAB in October 2020  Since her LMP, she admits to the use of tobacco products  no She claims she has gained   no pounds since the start of her pregnancy.  There are cats in the home in the home  no  She admits close contact with children on a regular basis  no  She has had chicken pox in the past no She has had Tuberculosis exposures, symptoms, or previously tested positive for TB   no Current or past history of domestic violence. no  Genetic Screening/Teratology Counseling: (Includes patient, baby's father, or anyone in either family with:)   1. Patient's age >/= 90 at [facility]  no 2. Thalassemia ([place], [place], Mediterranean, or Asian background): MCV<80  no 3. Neural tube defect (meningomyelocele, spina bifida, anencephaly)  no 4. Congenital heart defect  no  5. Down syndrome  no 6. Tay-Sachs (Jewish, [place]  no 7. Canavan's Disease  no 8. Sickle cell disease or trait (African)  no  9. Hemophilia or other blood disorders  no  10. Muscular dystrophy  no  11. Cystic fibrosis  no  12. Huntington's Chorea  no  13. Mental  retardation/autism  no 14. Other inherited genetic or chromosomal disorder  no 15. Maternal metabolic disorder (DM, PKU, etc)  no 16. Patient or FOB with a child with a birth defect not listed above no  16a. Patient or FOB with a birth defect themselves no 17. Recurrent pregnancy loss, or stillbirth  no  18. Any medications since LMP other than prenatal vitamins (include vitamins, supplements, OTC meds, drugs, alcohol)  no 19. Any other genetic/environmental exposure to discuss  no  Infection History:   1. Lives with someone with TB or TB exposed  no  2. Patient or partner has history of genital herpes  no 3. Rash or viral illness since LMP  no 4. History of STI (GC, CT, HPV, syphilis, HIV)  Chlamydia years ago 5. History of recent travel :  no  Other pertinent information:  no     Review of Systems:10 point review of systems negative unless otherwise noted in HPI  Past Medical History:  Past Medical History:  Diagnosis Date  . Miscarriage     Past Surgical History:  Past Surgical History:  Procedure Laterality Date  . MOUTH SURGERY      Gynecologic History: Patient's last menstrual period was 10/25/2019.  Obstetric History: G2P0010  Family History:  History reviewed. No pertinent family history.  Social History:  Social History   Socioeconomic History  . Marital status: Single    Spouse name: Not on file  .  Number of children: Not on file  . Years of education: Not on file  . Highest education level: Not on file  Occupational History  . Not on file  Tobacco Use  . Smoking status: Former Smoker    Types: Cigars  . Smokeless tobacco: Never Used  Substance and Sexual Activity  . Alcohol use: Not Currently    Comment: wine on weekends  . Drug use: Not Currently  . Sexual activity: Yes    Partners: Male    Birth control/protection: None  Other Topics Concern  . Not on file  Social History Narrative  . Not on file   Social Determinants of Health    Financial Resource Strain:   . Difficulty of Paying Living Expenses: Not on file  Food Insecurity:   . Worried About Charity fundraiser in the Last Year: Not on file  . Ran Out of Food in the Last Year: Not on file  Transportation Needs:   . Lack of Transportation (Medical): Not on file  . Lack of Transportation (Non-Medical): Not on file  Physical Activity:   . Days of Exercise per Week: Not on file  . Minutes of Exercise per Session: Not on file  Stress:   . Feeling of Stress : Not on file  Social Connections:   . Frequency of Communication with Friends and Family: Not on file  . Frequency of Social Gatherings with Friends and Family: Not on file  . Attends Religious Services: Not on file  . Active Member of Clubs or Organizations: Not on file  . Attends Archivist Meetings: Not on file  . Marital Status: Not on file  Intimate Partner Violence: Not At Risk  . Fear of Current or Ex-Partner: No  . Emotionally Abused: No  . Physically Abused: No  . Sexually Abused: No    Allergies:  No Known Allergies  Medications: Prior to Admission medications   Medication Sig Start Date End Date Taking? Authorizing Provider  Prenatal Vit-Fe Fumarate-FA (PRENATAL VITAMINS) 28-0.8 MG TABS Take 28 mg by mouth daily. 12/11/19 03/10/20  Caren Macadam, MD    Physical Exam Vitals: Blood pressure 114/70, weight 122 lb (55.3 kg), last menstrual period 10/25/2019.  General: NAD HEENT: normocephalic, anicteric Thyroid: no enlargement, no palpable nodules Pulmonary: No increased work of breathing, CTAB Cardiovascular: RRR, distal pulses 2+ Abdomen: NABS, soft, non-tender, non-distended.  Umbilicus without lesions.  No hepatomegaly, splenomegaly or masses palpable. No evidence of hernia  Genitourinary:  External: Normal external female genitalia.  Normal urethral meatus, normal  Bartholin's and Skene's glands.    Vagina: Normal vaginal mucosa, no evidence of prolapse.     Cervix: Grossly normal in appearance, no bleeding, no CMT  Uterus:  Non-enlarged, mobile, normal contour.    Adnexa: ovaries non-enlarged, no adnexal masses  Rectal: deferred Extremities: no edema, erythema, or tenderness Neurologic: Grossly intact Psychiatric: mood appropriate, affect full   Assessment: 19 y.o. G2P0010 at [redacted]w[redacted]d by LMP presenting to initiate prenatal care  Plan: 1) Avoid alcoholic beverages. 2) Patient encouraged not to smoke.  3) Discontinue the use of all non-medicinal drugs and chemicals.  4) Take prenatal vitamins daily.  5) Nutrition, food safety (fish, cheese advisories, and high nitrite foods) and exercise discussed. 6) Hospital and practice style discussed with cross coverage system.  7) Genetic Screening, such as with 1st Trimester Screening, cell free fetal DNA, AFP testing, and Ultrasound, as well as with amniocentesis and CVS as appropriate, is discussed with patient.  At the conclusion of today's visit patient requested cell free DNA genetic testing 8) Patient is asked about travel to areas at risk for the Zika virus, and counseled to avoid travel and exposure to mosquitoes or sexual partners who may have themselves been exposed to the virus. Testing is discussed, and will be ordered as appropriate.  9) Urine culture, PAPtima today 10) Return in 1 week for dating and rob 11) NOB panel, sickle cell screen (future ordered) along with MaterniT 21 at 10+ weeks    Tresea Mall, CNM [facility] 12/20/2019, 12:11 PM

## 2019-12-23 LAB — URINE CULTURE

## 2019-12-24 ENCOUNTER — Other Ambulatory Visit: Payer: Self-pay | Admitting: Advanced Practice Midwife

## 2019-12-24 DIAGNOSIS — A749 Chlamydial infection, unspecified: Secondary | ICD-10-CM

## 2019-12-24 DIAGNOSIS — O2341 Unspecified infection of urinary tract in pregnancy, first trimester: Secondary | ICD-10-CM

## 2019-12-24 LAB — CYTOLOGY - PAP
Chlamydia: POSITIVE — AB
Comment: NEGATIVE
Comment: NEGATIVE
Comment: NORMAL
Diagnosis: NEGATIVE
Neisseria Gonorrhea: NEGATIVE
Trichomonas: NEGATIVE

## 2019-12-24 MED ORDER — AZITHROMYCIN 500 MG PO TABS: 1000.0000 mg | ORAL_TABLET | Freq: Once | ORAL | 0 refills | Status: AC

## 2019-12-24 MED ORDER — CEPHALEXIN 500 MG PO CAPS: 500.0000 mg | ORAL_CAPSULE | Freq: Four times a day (QID) | ORAL | 0 refills | Status: DC

## 2019-12-24 NOTE — Progress Notes (Unsigned)
Rx keflex sent to patient's pharmacy to treat uti from NOB visit. Also Rx azithromycin sent to treat chlamydia infection. Communicable disease reporting letter sent to [facility].

## 2019-12-27 ENCOUNTER — Telehealth: Payer: Self-pay

## 2019-12-27 ENCOUNTER — Ambulatory Visit (INDEPENDENT_AMBULATORY_CARE_PROVIDER_SITE_OTHER): Payer: Medicaid Other

## 2019-12-27 ENCOUNTER — Other Ambulatory Visit: Payer: Self-pay

## 2019-12-27 ENCOUNTER — Ambulatory Visit (INDEPENDENT_AMBULATORY_CARE_PROVIDER_SITE_OTHER): Payer: Medicaid Other | Admitting: Advanced Practice Midwife

## 2019-12-27 ENCOUNTER — Encounter: Payer: Self-pay | Admitting: Advanced Practice Midwife

## 2019-12-27 VITALS — BP 112/74 | Wt 122.0 lb

## 2019-12-27 DIAGNOSIS — Z3481 Encounter for supervision of other normal pregnancy, first trimester: Secondary | ICD-10-CM

## 2019-12-27 DIAGNOSIS — Z3A01 Less than 8 weeks gestation of pregnancy: Secondary | ICD-10-CM

## 2019-12-27 DIAGNOSIS — O3680X0 Pregnancy with inconclusive fetal viability, not applicable or unspecified: Secondary | ICD-10-CM | POA: Diagnosis not present

## 2019-12-27 DIAGNOSIS — Z3480 Encounter for supervision of other normal pregnancy, unspecified trimester: Secondary | ICD-10-CM

## 2019-12-27 NOTE — Progress Notes (Signed)
Dating scan today. No vb. No lof.  °

## 2019-12-27 NOTE — Telephone Encounter (Signed)
Please advise 

## 2019-12-27 NOTE — Telephone Encounter (Signed)
Pt called today stating that she is wanting to get a refill on her medication for the treatment of Chlamydia so that her partner would be treated too. Please advise if this is possible. Thank you

## 2019-12-27 NOTE — Telephone Encounter (Signed)
Partner should go to health department or to his PCP.

## 2019-12-27 NOTE — Progress Notes (Signed)
  Routine Prenatal Care Visit  Subjective  Allison Burke is a 19 y.o. G2P0010 at [redacted]w[redacted]d being seen today for ongoing prenatal care.  She is currently monitored for the following issues for this low-risk pregnancy and has Supervision of other normal pregnancy, antepartum on their problem list.  ----------------------------------------------------------------------------------- Patient reports no complaints.  We discussed her lab results and the importance of picking up her medications for UTI and chlamydia. She plans to get them today. We also discussed dating scan results today and will follow up with viability scan in 2 weeks.  . Vag. Bleeding: None.   . Leaking Fluid denies.  ----------------------------------------------------------------------------------- The following portions of the patient's history were reviewed and updated as appropriate: allergies, current medications, past family history, past medical history, past social history, past surgical history and problem list. Problem list updated.  Objective  Blood pressure 112/74, weight 122 lb (55.3 kg), last menstrual period 10/25/2019. Pregravid weight 122 lb (55.3 kg) Total Weight Gain 0 lb (0 kg) Urinalysis: Urine Protein    Urine Glucose    Fetal Status: Fetal Heart Rate (bpm): not present          Dating scan: 6 weeks 2 days with EDD of 08/19/2020, cardiac activity not seen, EDD adjusted and follow up viability scan needed  General:  Alert, oriented and cooperative. Patient is in no acute distress.  Skin: Skin is warm and dry. No rash noted.   Cardiovascular: Normal heart rate noted  Respiratory: Normal respiratory effort, no problems with respiration noted  Abdomen: Soft, gravid, appropriate for gestational age.       Pelvic:  Cervical exam deferred        Extremities: Normal range of motion.     Mental Status: Normal mood and affect. Normal behavior. Normal judgment and thought content.   Assessment   18 y.o. G2P0010 at  [redacted]w[redacted]d by  08/19/2020, by Ultrasound presenting for routine prenatal visit  Plan   pregnancy Problems (from 12/20/19 to present)    No problems associated with this episode.       Preterm labor symptoms and general obstetric precautions including but not limited to vaginal bleeding, contractions, leaking of fluid and fetal movement were reviewed in detail with the patient.   Return in about 2 weeks (around 01/10/2020) for viability scan and rob.  Tresea Mall, CNM 12/27/2019 10:37 AM

## 2019-12-28 ENCOUNTER — Encounter: Payer: Medicaid Other | Admitting: Advanced Practice Midwife

## 2019-12-28 ENCOUNTER — Other Ambulatory Visit: Payer: Self-pay | Admitting: Advanced Practice Midwife

## 2019-12-28 DIAGNOSIS — A749 Chlamydial infection, unspecified: Secondary | ICD-10-CM

## 2019-12-28 DIAGNOSIS — O98811 Other maternal infectious and parasitic diseases complicating pregnancy, first trimester: Secondary | ICD-10-CM

## 2019-12-28 MED ORDER — AZITHROMYCIN 500 MG PO TABS: 1000.0000 mg | ORAL_TABLET | Freq: Once | ORAL | 1 refills | Status: AC

## 2019-12-28 NOTE — Telephone Encounter (Signed)
Pt aware.

## 2019-12-28 NOTE — Progress Notes (Unsigned)
Reordered Azithromycin to treat chlamydia since patient was unable to keep first dose down.

## 2019-12-31 ENCOUNTER — Other Ambulatory Visit: Payer: Self-pay

## 2019-12-31 ENCOUNTER — Emergency Department: Payer: Medicaid Other

## 2019-12-31 ENCOUNTER — Emergency Department
Admission: EM | Admit: 2019-12-31 | Discharge: 2019-12-31 | Disposition: A | Discharge status: Home / Self Care | Payer: Medicaid Other | Attending: Emergency Medicine | Admitting: Emergency Medicine

## 2019-12-31 DIAGNOSIS — Z3A01 Less than 8 weeks gestation of pregnancy: Secondary | ICD-10-CM | POA: Diagnosis not present

## 2019-12-31 DIAGNOSIS — O26899 Other specified pregnancy related conditions, unspecified trimester: Secondary | ICD-10-CM

## 2019-12-31 DIAGNOSIS — O209 Hemorrhage in early pregnancy, unspecified: Secondary | ICD-10-CM

## 2019-12-31 DIAGNOSIS — Z87891 Personal history of nicotine dependence: Secondary | ICD-10-CM | POA: Diagnosis not present

## 2019-12-31 DIAGNOSIS — O4691 Antepartum hemorrhage, unspecified, first trimester: Secondary | ICD-10-CM | POA: Diagnosis present

## 2019-12-31 DIAGNOSIS — O039 Complete or unspecified spontaneous abortion without complication: Secondary | ICD-10-CM | POA: Diagnosis not present

## 2019-12-31 DIAGNOSIS — R102 Pelvic and perineal pain: Secondary | ICD-10-CM

## 2019-12-31 LAB — COMPREHENSIVE METABOLIC PANEL
ALT: 14 U/L (ref 0–44)
AST: 16 U/L (ref 15–41)
Albumin: 4.4 g/dL (ref 3.5–5.0)
Alkaline Phosphatase: 61 U/L (ref 38–126)
Anion gap: 10 (ref 5–15)
BUN: 8 mg/dL (ref 6–20)
CO2: 22 mmol/L (ref 22–32)
Calcium: 9.3 mg/dL (ref 8.9–10.3)
Chloride: 106 mmol/L (ref 98–111)
Creatinine, Ser: 0.57 mg/dL (ref 0.44–1.00)
GFR calc Af Amer: >60 mL/min (ref >60)
GFR calc non Af Amer: >60 mL/min (ref >60)
Glucose, Bld: 92 mg/dL (ref 70–99)
Potassium: 4 mmol/L (ref 3.5–5.1)
Sodium: 138 mmol/L (ref 135–145)
Total Bilirubin: 0.5 mg/dL (ref 0.3–1.2)
Total Protein: 7.7 g/dL (ref 6.5–8.1)

## 2019-12-31 LAB — URINALYSIS, COMPLETE (UACMP) WITH MICROSCOPIC
Bacteria, UA: NONE SEEN
Bilirubin Urine: NEGATIVE
Glucose, UA: NEGATIVE mg/dL
Ketones, ur: NEGATIVE mg/dL
Leukocytes,Ua: NEGATIVE
Nitrite: NEGATIVE
Protein, ur: NEGATIVE mg/dL
RBC / HPF: >50 RBC/hpf — ABNORMAL HIGH (ref 0–5)
Specific Gravity, Urine: 1.014 (ref 1.005–1.030)
pH: 5 (ref 5.0–8.0)

## 2019-12-31 LAB — CBC WITH DIFFERENTIAL/PLATELET
Abs Immature Granulocytes: 0.01 10*3/uL (ref 0.00–0.07)
Basophils Absolute: 0 10*3/uL (ref 0.0–0.1)
Basophils Relative: 0 %
Eosinophils Absolute: 0.1 10*3/uL (ref 0.0–0.5)
Eosinophils Relative: 2 %
HCT: 38.6 % (ref 36.0–46.0)
Hemoglobin: 12.5 g/dL (ref 12.0–15.0)
Immature Granulocytes: 0 %
Lymphocytes Relative: 32 %
Lymphs Abs: 1.7 10*3/uL (ref 0.7–4.0)
MCH: 30 pg (ref 26.0–34.0)
MCHC: 32.4 g/dL (ref 30.0–36.0)
MCV: 92.6 fL (ref 80.0–100.0)
Monocytes Absolute: 0.3 10*3/uL (ref 0.1–1.0)
Monocytes Relative: 6 %
Neutro Abs: 3.2 10*3/uL (ref 1.7–7.7)
Neutrophils Relative %: 60 %
Platelets: 185 10*3/uL (ref 150–400)
RBC: 4.17 MIL/uL (ref 3.87–5.11)
RDW: 13.2 % (ref 11.5–15.5)
WBC: 5.3 10*3/uL (ref 4.0–10.5)
nRBC: 0 % (ref 0.0–0.2)

## 2019-12-31 LAB — POCT PREGNANCY, URINE: Preg Test, Ur: POSITIVE — AB

## 2019-12-31 LAB — CHLAMYDIA/NGC RT PCR (ARMC ONLY)
Chlamydia Tr: NOT DETECTED
N gonorrhoeae: NOT DETECTED

## 2019-12-31 LAB — HCG, QUANTITATIVE, PREGNANCY: hCG, Beta Chain, Quant, S: 6350 m[IU]/mL — ABNORMAL HIGH (ref <5)

## 2019-12-31 LAB — LIPASE, BLOOD: Lipase: 28 U/L (ref 11–51)

## 2019-12-31 MED ORDER — TRAMADOL HCL 50 MG PO TABS: 50.0000 mg | ORAL_TABLET | Freq: Four times a day (QID) | ORAL | 0 refills | Status: DC | PRN

## 2019-12-31 MED ORDER — ACETAMINOPHEN 500 MG PO TABS
1000.0000 mg | ORAL_TABLET | Freq: Once | ORAL | Status: AC
  Administered 2019-12-31: 08:00:00 1000 mg via ORAL
  Filled 2019-12-31: qty 2

## 2019-12-31 NOTE — ED Notes (Signed)
Patient off unit to vag U/S.

## 2019-12-31 NOTE — ED Provider Notes (Signed)
[facility] Emergency Department Provider Note       Time seen: ----------------------------------------- 7:29 AM on 12/31/2019 -----------------------------------------   I have reviewed the triage vital signs and the nursing notes.  HISTORY   Chief Complaint Abdominal Pain    HPI Lezly Rumpf is a 19 y.o. female with a history of miscarriage who presents to the ED for vaginal bleeding, pain and cramping in early pregnancy.  Patient states she is around [redacted] weeks pregnant, bleeding and lower abdominal pain started 2 days ago.  She is G2, P0 Ab1.  Pain is severe, in the lower abdomen.  Past Medical History:  Diagnosis Date  . Miscarriage     Patient Active Problem List   Diagnosis Date Noted  . Supervision of other normal pregnancy, antepartum 12/20/2019    Past Surgical History:  Procedure Laterality Date  . MOUTH SURGERY      Allergies Patient has no known allergies.  Social History Social History   Tobacco Use  . Smoking status: Former Smoker    Types: Cigars  . Smokeless tobacco: Never Used  Substance Use Topics  . Alcohol use: Not Currently    Comment: wine on weekends  . Drug use: Not Currently    Review of Systems Constitutional: Negative for fever. Cardiovascular: Negative for chest pain. Respiratory: Negative for shortness of breath. Gastrointestinal: Positive for abdominal pain Genitourinary: Positive for vaginal bleeding Musculoskeletal: Negative for back pain. Skin: Negative for rash. Neurological: Negative for headaches, focal weakness or numbness.  All systems negative/normal/unremarkable except as stated in the HPI  ____________________________________________   PHYSICAL EXAM:  VITAL SIGNS: ED Triage Vitals  Enc Vitals Group     BP      Pulse      Resp      Temp      Temp src      SpO2      Weight      Height      Head Circumference      Peak Flow      Pain Score      Pain Loc      Pain Edu?     Excl. in GC?     Constitutional: Alert and oriented.  Mild distress Eyes: Conjunctivae are normal. Normal extraocular movements. Cardiovascular: Normal rate, regular rhythm. No murmurs, rubs, or gallops. Respiratory: Normal respiratory effort without tachypnea nor retractions. Breath sounds are clear and equal bilaterally. No wheezes/rales/rhonchi. Gastrointestinal: Suprapubic tenderness, no rebound or guarding.  Normal bowel sounds.  S Musculoskeletal: Nontender with normal range of motion in extremities. No lower extremity tenderness nor edema. Neurologic:  Normal speech and language. No gross focal neurologic deficits are appreciated.  Skin:  Skin is warm, dry and intact. No rash noted. Psychiatric: Anxious and tearful ____________________________________________  ED COURSE:  As part of my medical decision making, I reviewed the following data within the electronic MEDICAL RECORD NUMBER History obtained from family if available, nursing notes, old chart and ekg, as well as notes from prior ED visits. Patient presented for vaginal bleeding in early pregnancy, we will assess with labs and imaging as indicated at this time.   Procedures  Lalla Laham was evaluated in Emergency Department on 12/31/2019 for the symptoms described in the history of present illness. She was evaluated in the context of the global COVID-19 pandemic, which necessitated consideration that the patient might be at risk for infection with the SARS-CoV-2 virus that causes COVID-19. Institutional protocols and algorithms that pertain to  the evaluation of patients at risk for COVID-19 are in a state of rapid change based on information released by regulatory bodies including the CDC and federal and state organizations. These policies and algorithms were followed during the patient's care in the ED.  ____________________________________________   LABS (pertinent positives/negatives)  Labs Reviewed  URINALYSIS, COMPLETE  (UACMP) WITH MICROSCOPIC - Abnormal; Notable for the following components:      Result Value   Color, Urine STRAW (*)    APPearance CLEAR (*)    Hgb urine dipstick MODERATE (*)    RBC / HPF >50 (*)    All other components within normal limits  POCT PREGNANCY, URINE - Abnormal; Notable for the following components:   Preg Test, Ur POSITIVE (*)    All other components within normal limits  CHLAMYDIA/NGC RT PCR ([facility] ONLY)  COMPREHENSIVE METABOLIC PANEL  LIPASE, BLOOD  CBC WITH DIFFERENTIAL/PLATELET  CBC WITH DIFFERENTIAL/PLATELET  HCG, QUANTITATIVE, PREGNANCY    RADIOLOGY Images were viewed by me  Pregnancy ultrasound  IMPRESSION:  Embryo again without cardiac activity. Gestational sac now has an  abnormal appearance and migrated inferiorly during the course of  examination. Likely reflects spontaneous abortion in progress.  ____________________________________________   DIFFERENTIAL DIAGNOSIS   Threatened miscarriage, ectopic, UTI, ovarian cyst, torsion, appendicitis  FINAL ASSESSMENT AND PLAN  Miscarriage   Plan: The patient had presented for vaginal bleeding and cramping in early pregnancy. Patient's labs overall reassuring. Patient's imaging indicates fetal demise.  While in the ER she did pass all of the fetal contents while she was an ultrasound in one of the bathrooms.  We were unable to provide a specimen for pathology.  She is encouraged to have close outpatient follow-up with her OB/GYN doctor.   Laurence Aly, MD    Note: This note was generated in part or whole with voice recognition software. Voice recognition is usually quite accurate but there are transcription errors that can and very often do occur. I apologize for any typographical errors that were not detected and corrected.     Earleen Newport, MD 12/31/19 1124

## 2019-12-31 NOTE — ED Notes (Signed)
Labs re drawn and sent

## 2019-12-31 NOTE — ED Triage Notes (Signed)
Patient from home via ems, [redacted] weeks pregnant c/o of abd pain began last night, vag bleeding x 2 days.

## 2020-01-01 ENCOUNTER — Encounter: Payer: Medicaid Other | Admitting: Obstetrics and Gynecology

## 2020-01-10 ENCOUNTER — Encounter: Payer: Medicaid Other | Admitting: Certified Nurse Midwife

## 2020-04-11 ENCOUNTER — Ambulatory Visit: Payer: Medicaid Other | Admitting: Obstetrics and Gynecology

## 2020-04-14 ENCOUNTER — Ambulatory Visit: Payer: Medicaid Other | Admitting: Obstetrics and Gynecology

## 2020-04-18 ENCOUNTER — Other Ambulatory Visit (HOSPITAL_COMMUNITY)
Admission: RE | Admit: 2020-04-18 | Discharge: 2020-04-18 | Disposition: A | Discharge status: Home / Self Care | Payer: Medicaid Other | Source: Ambulatory Visit | Attending: Obstetrics and Gynecology | Admitting: Obstetrics and Gynecology

## 2020-04-18 ENCOUNTER — Ambulatory Visit (INDEPENDENT_AMBULATORY_CARE_PROVIDER_SITE_OTHER): Payer: Medicaid Other

## 2020-04-18 ENCOUNTER — Other Ambulatory Visit: Payer: Self-pay | Admitting: Obstetrics and Gynecology

## 2020-04-18 ENCOUNTER — Encounter: Payer: Self-pay | Admitting: Obstetrics and Gynecology

## 2020-04-18 ENCOUNTER — Ambulatory Visit (INDEPENDENT_AMBULATORY_CARE_PROVIDER_SITE_OTHER): Payer: Medicaid Other | Admitting: Obstetrics and Gynecology

## 2020-04-18 ENCOUNTER — Other Ambulatory Visit: Payer: Self-pay

## 2020-04-18 VITALS — BP 92/58 | Ht 69.0 in | Wt 117.0 lb

## 2020-04-18 DIAGNOSIS — Z3401 Encounter for supervision of normal first pregnancy, first trimester: Secondary | ICD-10-CM

## 2020-04-18 DIAGNOSIS — Z3A01 Less than 8 weeks gestation of pregnancy: Secondary | ICD-10-CM | POA: Diagnosis not present

## 2020-04-18 DIAGNOSIS — N912 Amenorrhea, unspecified: Secondary | ICD-10-CM | POA: Diagnosis not present

## 2020-04-18 DIAGNOSIS — O210 Mild hyperemesis gravidarum: Secondary | ICD-10-CM | POA: Diagnosis not present

## 2020-04-18 DIAGNOSIS — Z3200 Encounter for pregnancy test, result unknown: Secondary | ICD-10-CM | POA: Diagnosis not present

## 2020-04-18 DIAGNOSIS — O2611 Low weight gain in pregnancy, first trimester: Secondary | ICD-10-CM

## 2020-04-18 LAB — POCT URINALYSIS DIPSTICK OB: Glucose, UA: NEGATIVE

## 2020-04-18 LAB — POCT URINE PREGNANCY: Preg Test, Ur: POSITIVE — AB

## 2020-04-18 MED ORDER — DOCUSATE SODIUM 100 MG PO CAPS: 100.0000 mg | ORAL_CAPSULE | Freq: Two times a day (BID) | ORAL | 2 refills | Status: AC | PRN

## 2020-04-18 MED ORDER — PROMETHAZINE HCL 25 MG PO TABS: 25.0000 mg | ORAL_TABLET | Freq: Four times a day (QID) | ORAL | 3 refills | Status: AC | PRN

## 2020-04-18 MED ORDER — ONDANSETRON 4 MG PO TBDP: 4.0000 mg | ORAL_TABLET | Freq: Four times a day (QID) | ORAL | 3 refills | Status: AC | PRN

## 2020-04-18 NOTE — Patient Instructions (Addendum)
Initial steps to help :   B6 (pyridoxine) 25 mg,  3-4 times a day Unisom (doxylamine) 25 mg at bedtime **B6 and Unisom are available as a combination prescription medications called diclegis and bonjesta  B1 (thiamin)  50-100 mg 1-4 a day  Continue prenatal vitamin with iron and thiamin. If it is not tolerated switch to 1 mg of folic acid.  Can add medication for gastric reflux if needed.  Subsequent steps to be added to B1, B6, and Unisom:  1. Antihistamine (one of the following medications) Dramamine      25-50 mg every 4-6 hours Benadryl      25-50 mg every 4-6 hours Meclizine      25 mg every 6 hours  2. Dopamine Antagonist (one of the following medications) Metoclopramide  (Reglan)  5-10 mg every 6-8 hours         PO Promethazine   (Phenergan)   12.5-25 mg every 4-6 hours      PO or rectal Prochlorperazine  (Compazine)  5-10 mg every 6-8 hours     25mg BID rectally    Subsequent steps if there has still not been improvement in symptoms:  3. Daily stool softner  4. Ondansetron  (Zofran)   4-8 mg every 6-8 hours   

## 2020-04-18 NOTE — Progress Notes (Signed)
04/18/2020   Chief Complaint: Missed period  Transfer of Care Patient: no  History of Present Illness: Ms. Umholtz is a 19 y.o. G3P0020 [redacted]w[redacted]d based on Patient's last menstrual period was 02/28/2020. with an Estimated Date of Delivery: 08/19/20, with the above CC.   Her periods were: regular periods every month She was using no method when she conceived.  She has Positive signs or symptoms of nausea/vomiting of pregnancy. She has Negative signs or symptoms of miscarriage or preterm labor She was not taking different medications around the time she conceived/early pregnancy. Since her LMP, she has not used alcohol Since her LMP, she has not used tobacco products Since her LMP, she has not used illegal drugs.    Current or past history of domestic violence. no  Infection History:  1. Since her LMP, she has not had a viral illness.  2. She denies close contact with children on a regular basis      3. She denies a history of chicken pox. She denies vaccination for chicken pox in the past. 4. Patient or partner has history of genital herpes  no 5. History of STI (GC, CT, HPV, syphilis, HIV)  yes - hx of chlamydia in 2021, treated 6.  She does not live with someone with TB or TB exposed. 7. History of recent travel :  no 8. She identifies Negative Zika risk factors for her and her partner 74. There are not cats in the home in the home.  She understands that while pregnant she should not change cat litter.   Genetic Screening Questions: (Includes patient, baby's father, or anyone in either family)   1. Patient's age >/= 61 at [facility]  no 2. Thalassemia ([place], [place], Mediterranean, or Asian background): MCV<80  no 3. Neural tube defect (meningomyelocele, spina bifida, anencephaly)  no 4. Congenital heart defect  no  5. Down syndrome  no 6. Tay-Sachs (Jewish, [place]  no 7. Canavan's Disease  no 8. Sickle cell disease or trait (African)  no  9. Hemophilia or other blood disorders   no  10. Muscular dystrophy  no  11. Cystic fibrosis  no  12. Huntington's Chorea  no  13. Mental retardation/autism  no 14. Other inherited genetic or chromosomal disorder  no 15. Maternal metabolic disorder (DM, PKU, etc)  no 16. Patient or FOB with a child with a birth defect not listed above no  16a. Patient or FOB with a birth defect themselves no 17. Recurrent pregnancy loss, or stillbirth  no  18. Any medications since LMP other than prenatal vitamins (include vitamins, supplements, OTC meds, drugs, alcohol)  no 19. Any other genetic/environmental exposure to discuss  no  ROS:  Review of Systems  Constitutional: Negative for chills, fever, malaise/fatigue and weight loss.  HENT: Negative for congestion, hearing loss and sinus pain.   Eyes: Negative for blurred vision and double vision.  Respiratory: Negative for cough, sputum production, shortness of breath and wheezing.   Cardiovascular: Negative for chest pain, palpitations, orthopnea and leg swelling.  Gastrointestinal: Positive for nausea and vomiting. Negative for abdominal pain, constipation and diarrhea.  Genitourinary: Negative for dysuria, flank pain, frequency, hematuria and urgency.  Musculoskeletal: Negative for back pain, falls and joint pain.  Skin: Negative for itching and rash.  Neurological: Negative for dizziness and headaches.  Psychiatric/Behavioral: Negative for depression, substance abuse and suicidal ideas. The patient is not nervous/anxious.     OBGYN History: As per HPI. OB History  Gravida Para Term Preterm  AB Living  4       2    SAB TAB Ectopic Multiple Live Births  2            # Outcome Date GA Lbr Len/2nd Weight Sex Delivery Anes PTL Lv  4 Current           3 Gravida           2 SAB           1 SAB             Any issues with any prior pregnancies: no Any prior children are healthy, doing well, without any problems or issues: not applicable Last pap smear 2021- NIL History of STIs:  Yes   Past Medical History: Past Medical History:  Diagnosis Date  . Miscarriage     Past Surgical History: Past Surgical History:  Procedure Laterality Date  . MOUTH SURGERY      Family History:  Family History  Problem Relation Age of Onset  . Asthma Mother    She denies any female cancers, bleeding or blood clotting disorders.   Social History:  Social History   Socioeconomic History  . Marital status: Single    Spouse name: Not on file  . Number of children: Not on file  . Years of education: Not on file  . Highest education level: Not on file  Occupational History  . Not on file  Tobacco Use  . Smoking status: Former Smoker    Types: Cigars  . Smokeless tobacco: Never Used  Substance and Sexual Activity  . Alcohol use: Not Currently    Comment: wine on weekends  . Drug use: Not Currently  . Sexual activity: Yes    Partners: Male    Birth control/protection: None  Other Topics Concern  . Not on file  Social History Narrative   Right handed   Lives in a two story home with boyfriend and his sister.   Social Determinants of Health   Financial Resource Strain:   . Difficulty of Paying Living Expenses:   Food Insecurity:   . Worried About Charity fundraiser in the Last Year:   . Arboriculturist in the Last Year:   Transportation Needs:   . Film/video editor (Medical):   Marland Kitchen Lack of Transportation (Non-Medical):   Physical Activity:   . Days of Exercise per Week:   . Minutes of Exercise per Session:   Stress:   . Feeling of Stress :   Social Connections:   . Frequency of Communication with Friends and Family:   . Frequency of Social Gatherings with Friends and Family:   . Attends Religious Services:   . Active Member of Clubs or Organizations:   . Attends Archivist Meetings:   Marland Kitchen Marital Status:   Intimate Partner Violence: Not At Risk  . Fear of Current or Ex-Partner: No  . Emotionally Abused: No  . Physically Abused: No  .  Sexually Abused: No   Allergy: No Known Allergies  Current Outpatient Medications:  Current Outpatient Medications:  .  Prenatal Vit-Fe Fumarate-FA (PRENATAL PO), Take by mouth daily., Disp: , Rfl:  .  docusate sodium (COLACE) 100 MG capsule, Take 1 capsule (100 mg total) by mouth 2 (two) times daily as needed., Disp: 30 capsule, Rfl: 2 .  ondansetron (ZOFRAN ODT) 4 MG disintegrating tablet, Take 1 tablet (4 mg total) by mouth every 6 (six) hours as needed for nausea.,  Disp: 60 tablet, Rfl: 3 .  promethazine (PHENERGAN) 25 MG tablet, Take 1 tablet (25 mg total) by mouth every 6 (six) hours as needed for nausea or vomiting., Disp: 120 tablet, Rfl: 3  Physical Exam: Physical Exam  Constitutional: She is oriented to person, place, and time and well-developed, well-nourished, and in no distress.  HENT:  Head: Normocephalic and atraumatic.  Eyes: Pupils are equal, round, and reactive to light.  Neck: No thyromegaly present.  Cardiovascular: Normal rate and regular rhythm.  Pulmonary/Chest: Effort normal.  Abdominal: Soft. Bowel sounds are normal. She exhibits no distension. There is no abdominal tenderness. There is no rebound and no guarding.  Musculoskeletal:        General: Normal range of motion.     Cervical back: Normal range of motion and neck supple.  Neurological: She is alert and oriented to person, place, and time.  Skin: Skin is warm and dry.  Psychiatric: Affect and judgment normal.  Nursing note and vitals reviewed.  Assessment: Ms. Rubel is a 19 y.o. G3P0020 [redacted]w[redacted]d based on Patient's last menstrual period was 02/28/2020. with an Estimated Date of Delivery: 08/19/20,  for prenatal care.  Plan:  1) Avoid alcoholic beverages. 2) Patient encouraged not to smoke.  3) Discontinue the use of all non-medicinal drugs and chemicals.  4) Take prenatal vitamins daily.  5) Seatbelt use advised 6) Nutrition, food safety (fish, cheese advisories, and high nitrite foods) and exercise  discussed. 7) Hospital and practice style delivering at [facility] discussed  8) Patient is asked about travel to areas at risk for the Zika virus, and counseled to avoid travel and exposure to mosquitoes or sexual partners who may have themselves been exposed to the virus. Testing is discussed, and will be ordered as appropriate.  9) Childbirth classes at [facility] advised 10) Genetic Screening, such as with 1st Trimester Screening, cell free fetal DNA, AFP testing, and Ultrasound, as well as with amniocentesis and CVS as appropriate, is discussed with patient. She plans to decline genetic testing this pregnancy.  Discussed management of nausea in pregnancy. Sent rx for patient Encouraged weight gain in pregnancy. Patient underweight. Sent referral to nutrition.   Problem list reviewed and updated.  I discussed the assessment and treatment plan with the patient. The patient was provided an opportunity to ask questions and all were answered. The patient agreed with the plan and demonstrated an understanding of the instructions.  Adelene Idler MD [facility], [facility] 04/18/2020 12:24 PM

## 2020-04-19 LAB — RPR+RH+ABO+RUB AB+AB SCR+CB...
Antibody Screen: NEGATIVE
HIV Screen 4th Generation wRfx: NONREACTIVE
Hematocrit: 39.1 % (ref 34.0–46.6)
Hemoglobin: 12.8 g/dL (ref 11.1–15.9)
Hepatitis B Surface Ag: NEGATIVE
MCH: 28.7 pg (ref 26.6–33.0)
MCHC: 32.7 g/dL (ref 31.5–35.7)
MCV: 88 fL (ref 79–97)
Platelets: 244 10*3/uL (ref 150–450)
RBC: 4.46 x10E6/uL (ref 3.77–5.28)
RDW: 15 % (ref 11.7–15.4)
RPR Ser Ql: NONREACTIVE
Rh Factor: POSITIVE
Rubella Antibodies, IGG: 5.95 index (ref >0.99)
Varicella zoster IgG: <135 index — ABNORMAL LOW (ref >165)
WBC: 5.5 10*3/uL (ref 3.4–10.8)

## 2020-04-19 LAB — HEPATITIS C ANTIBODY: Hep C Virus Ab: <0.1 s/co ratio (ref 0.0–0.9)

## 2020-04-22 LAB — URINE CULTURE

## 2020-04-22 LAB — CANNABINOID (GC/MS), URINE
Cannabinoid: POSITIVE — AB
Carboxy THC (GC/MS): 296 ng/mL

## 2020-04-22 LAB — MONITOR DRUG PROFILE 10(MW)
Amphetamine Scrn, Ur: NEGATIVE ng/mL
BARBITURATE SCREEN URINE: NEGATIVE ng/mL
BENZODIAZEPINE SCREEN, URINE: NEGATIVE ng/mL
Cocaine (Metab) Scrn, Ur: NEGATIVE ng/mL
Creatinine(Crt), U: 217.8 mg/dL (ref 20.0–300.0)
Methadone Screen, Urine: NEGATIVE ng/mL
OXYCODONE+OXYMORPHONE UR QL SCN: NEGATIVE ng/mL
Opiate Scrn, Ur: NEGATIVE ng/mL
Ph of Urine: 6.5 (ref 4.5–8.9)
Phencyclidine Qn, Ur: NEGATIVE ng/mL
Propoxyphene Scrn, Ur: NEGATIVE ng/mL

## 2020-04-22 LAB — CERVICOVAGINAL ANCILLARY ONLY
Chlamydia: NEGATIVE
Comment: NEGATIVE
Comment: NORMAL
Neisseria Gonorrhea: NEGATIVE

## 2021-05-08 ENCOUNTER — Other Ambulatory Visit: Payer: Medicaid Other

## 2021-07-14 IMAGING — US US OB < 14 WEEKS - US OB TV
1 series · 14 of 28 positions shown · non-contrast
Comparison: None.

CLINICAL DATA: Early pregnancy, abdominal pain, vaginal bleeding

EXAM:
OBSTETRIC <14 WK US AND TRANSVAGINAL OB US
TECHNIQUE: Both transabdominal and transvaginal ultrasound examinations were
performed for complete evaluation of the gestation as well as the
maternal uterus, adnexal regions, and pelvic cul-de-sac.
Transvaginal technique was performed to assess early pregnancy.

[Series 1: us ob < 14 weeks - us ob tv · 14 of 149 slices shown]
[im 6/149]
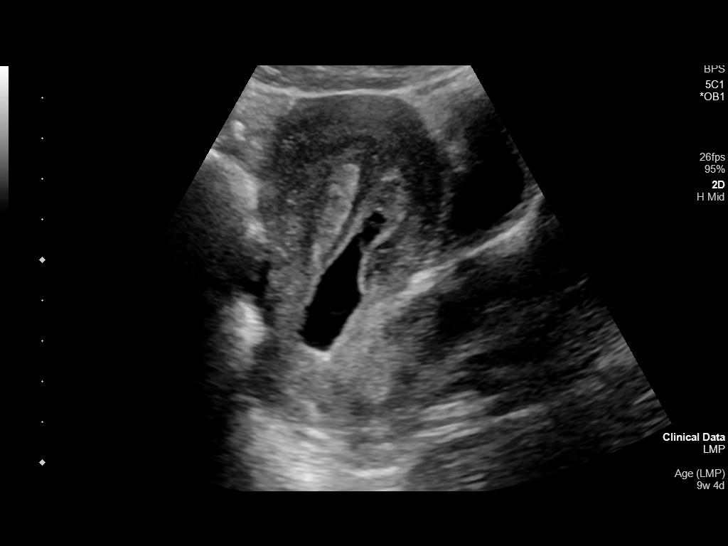
[im 17/149]
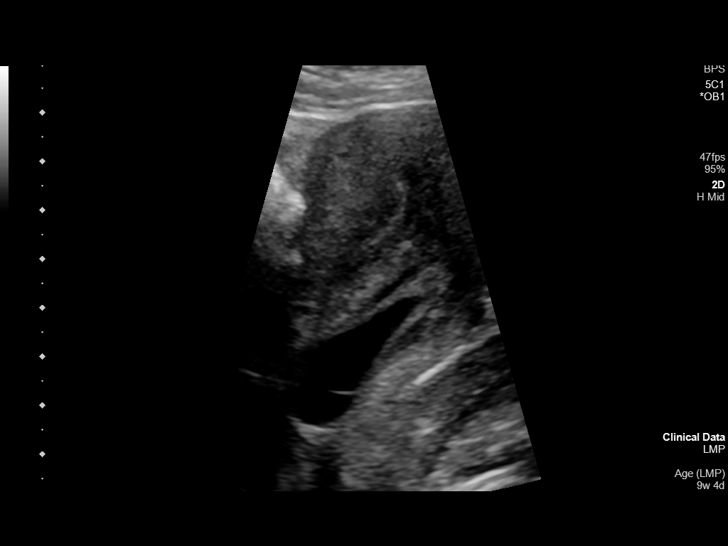
[im 28/149]
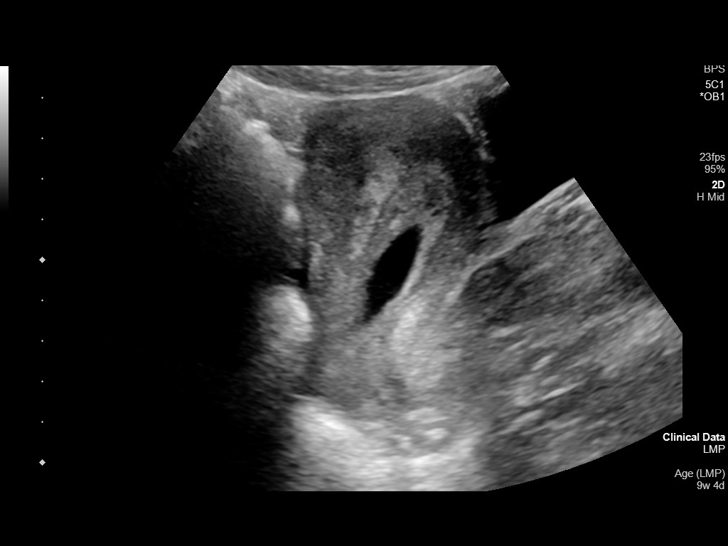
[im 39/149]
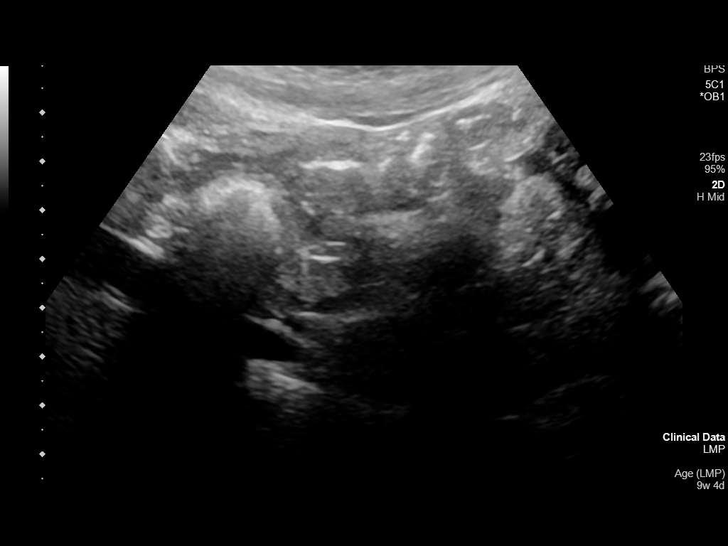
[im 50/149]
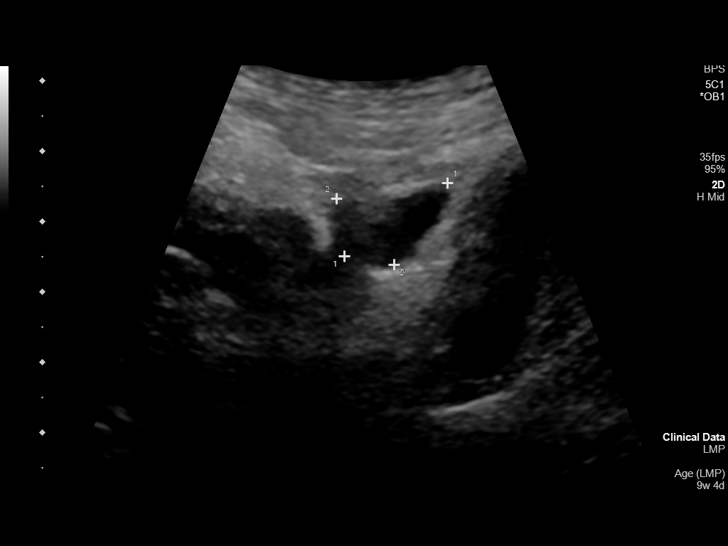
[im 61/149]
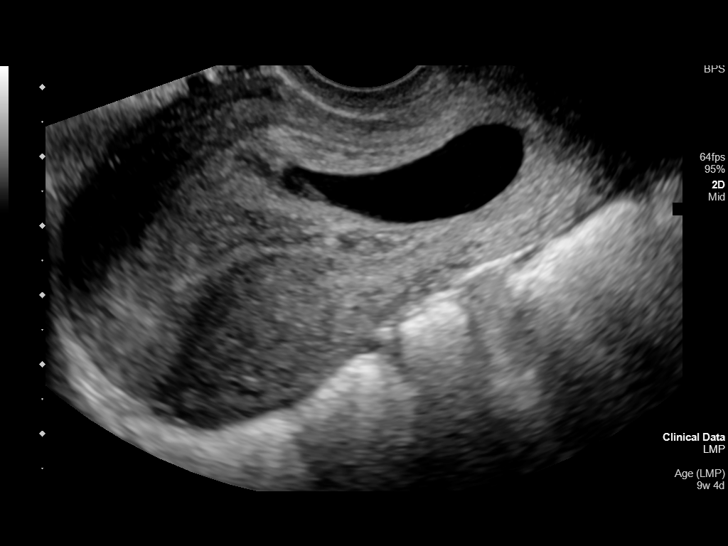
[im 72/149]
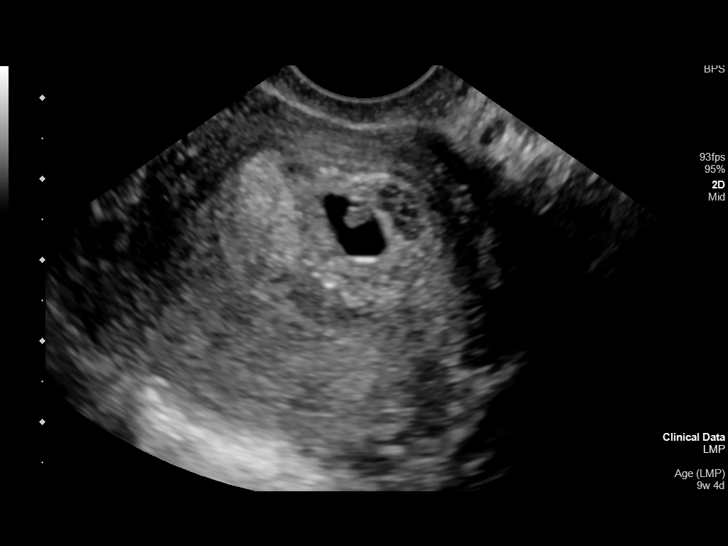
[im 83/149]
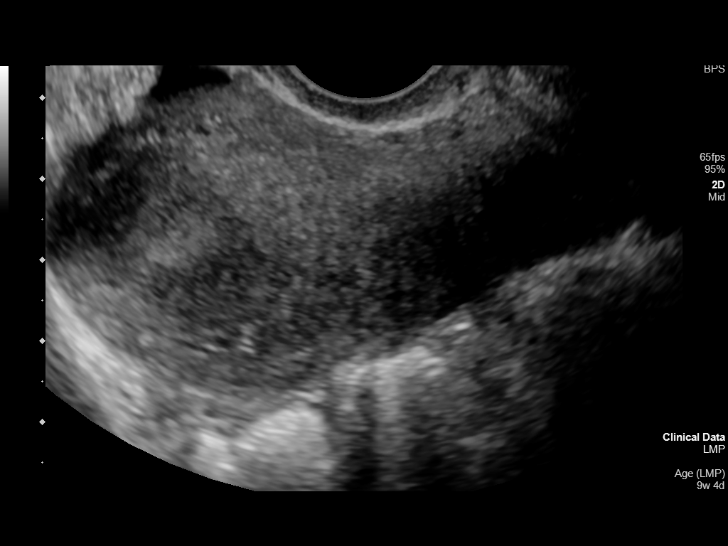
[im 94/149]
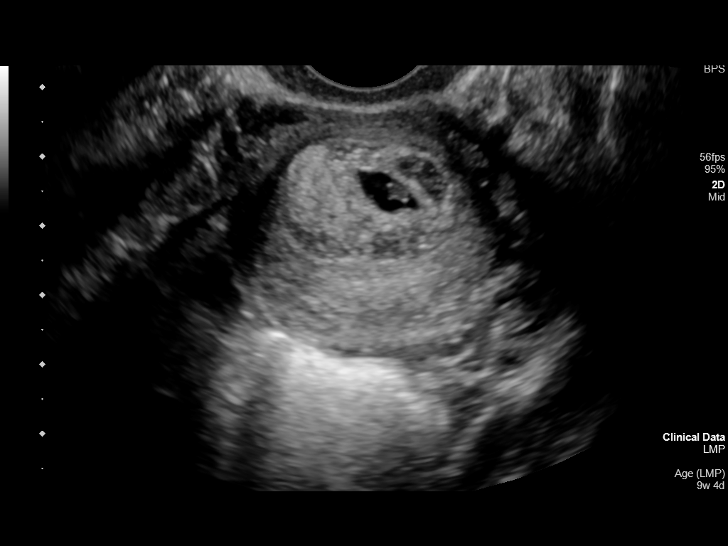
[im 105/149]
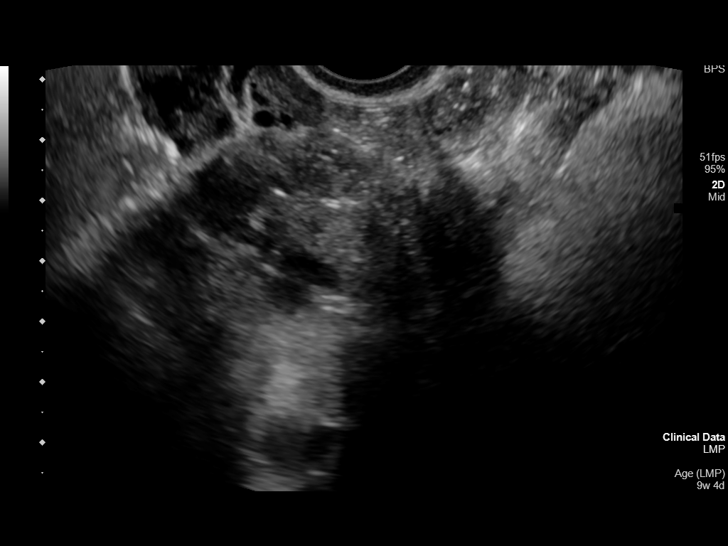
[im 116/149]
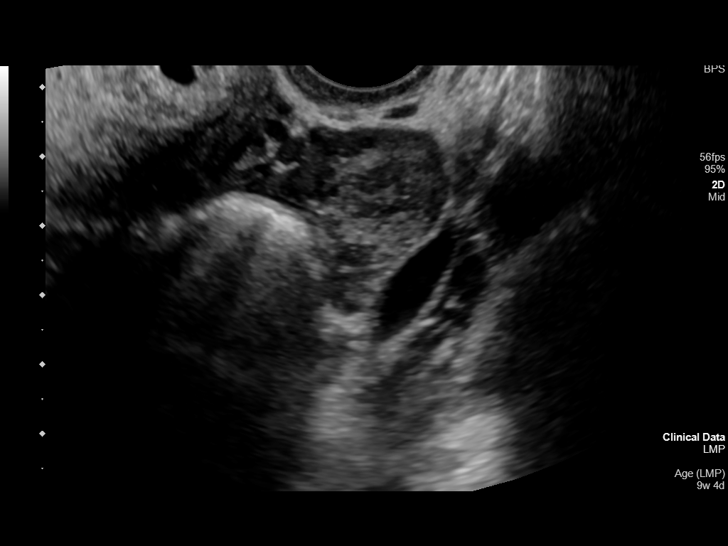
[im 127/149]
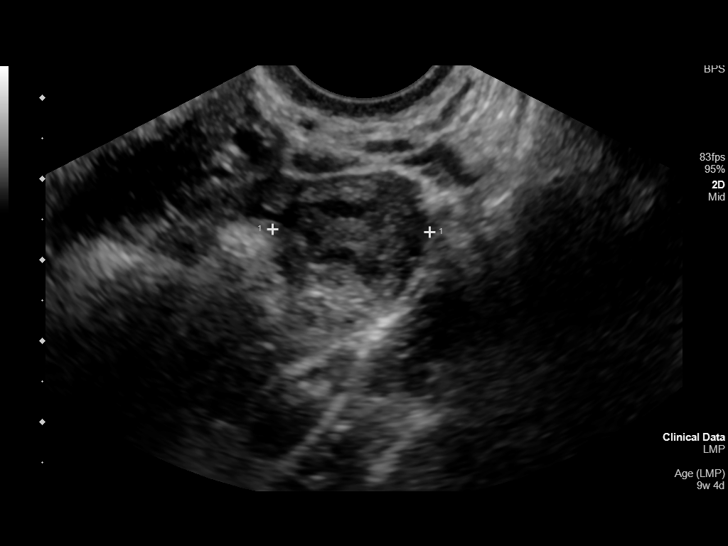
[im 138/149]
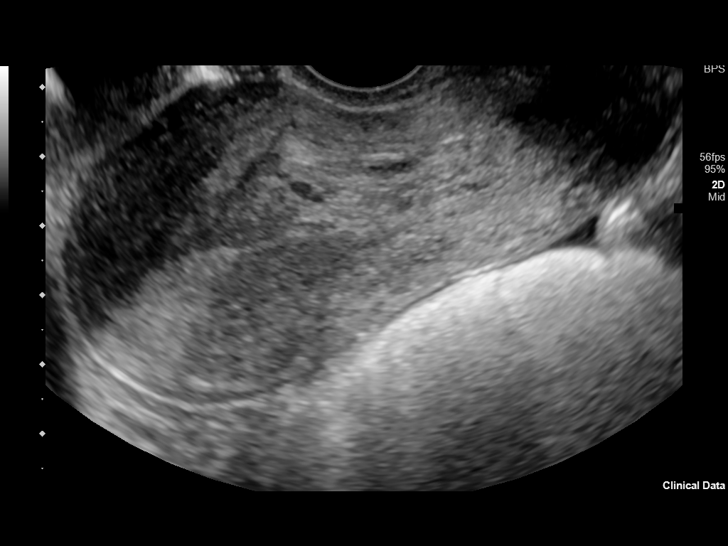
[im 149/149]
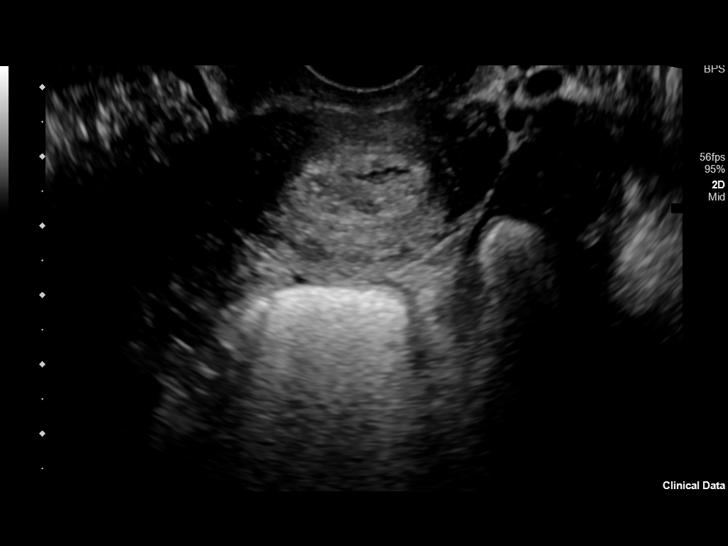

[14 of 28 positions shown; findings below may reference images not displayed]

FINDINGS: Intrauterine gestational sac: Single. The sac has an abnormal
elongated appearance and has migrated inferiorly. Over the course of
study, there is further inferior migration.

Yolk sac:  Not Visualized.

Embryo:  Visualized.

Cardiac Activity: Not Visualized.

CRL:  5.1 mm   6 w   2 d                  US EDC: 08/23/2020

Subchorionic hemorrhage:  Small.

Maternal uterus/adnexae: Right ovary is not visualized. Probable
left corpus luteum.
IMPRESSION: Embryo again without cardiac activity. Gestational sac now has an
abnormal appearance and migrated inferiorly during the course of
examination. Likely reflects spontaneous abortion in progress.

## 2022-01-26 DIAGNOSIS — Z5329 Procedure and treatment not carried out because of patient's decision for other reasons: Secondary | ICD-10-CM

## 2022-01-26 NOTE — ED Provider Notes (Shared)
EMERGENCY DEPARTMENT HISTORY AND PHYSICAL EXAM    11:41 PM      Date: 01/26/2022  Patient Name: Anita Marsh    History of Presenting Illness     Chief Complaint   Patient presents with    Hand Laceration         History Provided By: Patient    Additional History (Context): Anita Marsh is a 21 y.o. female with {No past medical history on file.} who presents with ***.  Pt arrives ambulatory to triage with complaints of R thumb laceration.   States she was cutting up fruit for her child when she missed the apple and cut her thumb approx 45 minutes ago.     Minimal bleeding in triage.   Lac noted.   Washed out with NS and dressing applied.      Reports she is up to date on vaccines.            PCP: None None    No current facility-administered medications for this encounter.     No current outpatient medications on file.       Past History     Past Medical History:  No past medical history on file.    Past Surgical History:  No past surgical history on file.    Family History:  No family history on file.    Social History:  Social History     Tobacco Use    Smoking status: Former   Substance Use Topics    Alcohol use: Yes    Drug use: Never       Allergies:  No Known Allergies      Review of Systems       Review of Systems      Physical Exam   BP 103/67    Pulse 96    Temp 98.9 ??F (37.2 ??C) (Oral)    Resp 18    Ht 5\' 9"  (1.753 m)    Wt 132 lb (59.9 kg)    SpO2 98%    BMI 19.49 kg/m??       Physical Exam      Diagnostic Study Results     Labs -  No results found for this or any previous visit (from the past 12 hour(s)).    Radiologic Studies -   No orders to display         Medical Decision Making   I am the first provider for this patient.    I reviewed the vital signs, available nursing notes, past medical history, past surgical history, family history and social history.    Vital Signs-Reviewed the patient's vital signs.    Records Reviewed: Nursing Notes and Old Medical Records (Time of Review: 11:41  PM)    ED Course: Progress Notes, Reevaluation, and Consults:  *** Reviewed results with patient. Discussed need for close outpatient follow-up this week for reassessment. Discussed strict return precautions, including     Provider Notes (Medical Decision Making): ***      Diagnosis     Clinical Impression: No diagnosis found.    Disposition: home      No follow-up provider specified.        Medication List      You have not been prescribed any medications.          Dictation disclaimer:  Please note that this dictation was completed with Dragon, the computer voice recognition software.  Quite often unanticipated grammatical, syntax, homophones, and other  interpretive errors are inadvertently transcribed by the computer software.  Please disregard these errors.  Please excuse any errors that have escaped final proofreading.

## 2022-01-26 NOTE — ED Triage Notes (Signed)
Pt arrives ambulatory to triage with complaints of R thumb laceration.   States she was cutting up fruit for her child when she missed the apple and cut her thumb approx 45 minutes ago.    Minimal bleeding in triage.   Lac noted.   Washed out with NS and dressing applied.     Reports she is up to date on vaccines.       No past medical history on file.

## 2022-01-27 ENCOUNTER — Inpatient Hospital Stay: Admit: 2022-01-27 | Discharge: 2022-01-27 | Payer: MEDICAID | Attending: Emergency Medicine

## 2022-01-27 ENCOUNTER — Inpatient Hospital Stay
Admit: 2022-01-27 | Discharge: 2022-01-27 | Disposition: A | Discharge status: Home / Self Care | Payer: MEDICAID | Attending: Emergency Medicine

## 2022-01-27 DIAGNOSIS — S61012A Laceration without foreign body of left thumb without damage to nail, initial encounter: Secondary | ICD-10-CM

## 2022-01-27 DIAGNOSIS — T148XXA Other injury of unspecified body region, initial encounter: Secondary | ICD-10-CM

## 2022-01-27 NOTE — ED Provider Notes (Signed)
EMERGENCY DEPARTMENT HISTORY AND PHYSICAL EXAM    Date: 01/27/2022  Patient Name: Anita Marsh    History of Presenting Illness     Chief Complaint   Patient presents with    Laceration         History Provided By: Patient      Additional History (Context): Anita Marsh is a 21 y.o. female who presents today for laceration noted to the left thumb after preparing a meal for her child.  Patient reports she went to another emergency room last night however they were too busy so she left.  States that her tetanus is up-to-date.  Has not tried anything additional for the wound at home.      PCP: None None    No current facility-administered medications for this encounter.     No current outpatient medications on file.       Past History     Past Medical History:  History reviewed. No pertinent past medical history.    Past Surgical History:  History reviewed. No pertinent surgical history.    Family History:  History reviewed. No pertinent family history.    Social History:  Social History     Tobacco Use    Smoking status: Former   Substance Use Topics    Alcohol use: Yes    Drug use: Never       Allergies:  No Known Allergies      Review of Systems   Review of Systems   Constitutional:  Negative for chills, diaphoresis and fever.   HENT:  Negative for congestion and sore throat.    Eyes:  Negative for discharge.   Respiratory:  Negative for shortness of breath and wheezing.    Cardiovascular:  Negative for chest pain.   Gastrointestinal:  Negative for abdominal distention, diarrhea, nausea and vomiting.   Genitourinary:  Negative for dysuria and urgency.   Musculoskeletal:  Negative for arthralgias.   Skin:  Positive for wound. Negative for rash.   Neurological:  Negative for headaches.   Psychiatric/Behavioral:  Negative for behavioral problems. The patient is not nervous/anxious.    All other systems reviewed and are negative.  All Other Systems Negative  Physical Exam     Vitals:    01/27/22 1603   BP:  109/68   Pulse: (!) 105   Resp: 18   Temp: 98.1 ??F (36.7 ??C)   TempSrc: Oral   SpO2: 100%   Weight: 132 lb (59.9 kg)   Height: 5\' 9"  (1.753 m)     Physical Exam  Constitutional:       General: She is not in acute distress.     Appearance: She is normal weight.   HENT:      Head: Atraumatic.      Nose: Nose normal.      Mouth/Throat:      Mouth: Mucous membranes are moist.   Eyes:      Extraocular Movements: Extraocular movements intact.      Pupils: Pupils are equal, round, and reactive to light.   Cardiovascular:      Rate and Rhythm: Normal rate.   Pulmonary:      Effort: Pulmonary effort is normal.      Breath sounds: Normal breath sounds.   Abdominal:      General: There is no distension.      Tenderness: There is no abdominal tenderness.   Musculoskeletal:         General: Normal range  of motion.      Cervical back: Normal range of motion.   Skin:     Comments: 1 inch very superficial laceration noted to the left thumb.  No foreign body.  Full range of motion of all digits and joint spaces.  Cap refill less than 2 seconds   Neurological:      General: No focal deficit present.      Mental Status: She is alert and oriented to person, place, and time. Mental status is at baseline.   Psychiatric:         Thought Content: Thought content normal.         Diagnostic Study Results     Labs -   No results found for this or any previous visit (from the past 12 hour(s)).    Radiologic Studies -   No orders to display           Medical Decision Making   I am the first provider for this patient.    I reviewed the vital signs, available nursing notes, past medical history, past surgical history, family history and social history.    Vital Signs-Reviewed the patient's vital signs.      Records Reviewed: Nursing Notes and Old Medical Records     Procedures: None   Lac Repair    Date/Time: 01/27/2022 4:51 PM  Performed by: Maryclare Labrador, PA  Authorized by: Tomasita Crumble, DO     Consent:     Consent obtained:  Verbal    Consent  given by:  Patient    Risks, benefits, and alternatives were discussed: yes      Risks discussed:  Infection, need for additional repair and nerve damage    Alternatives discussed:  No treatment and delayed treatment  Universal protocol:     Patient identity confirmed:  Verbally with patient  Anesthesia:     Anesthesia method:  None  Laceration details:     Location:  Finger  Exploration:     Imaging outcome: foreign body not noted      Wound exploration: wound explored through full range of motion and entire depth of wound visualized    Treatment:     Area cleansed with:  Chlorhexidine    Debridement:  None  Skin repair:     Repair method:  Steri-Strips  Approximation:     Approximation:  Close  Repair type:     Repair type:  Simple  Post-procedure details:     Dressing:  Open (no dressing)    Provider Notes (Medical Decision Making):     differential diagnosis: Laceration, contusion, abrasion    Plan: Have discussed with patient she is outside the window for repair at this time however will clean extensively and repaired with Steri-Strips.  Have given at home precautions.  Advised patient return as needed for wound check.  Will discharge home.           MED RECONCILIATION:  No current facility-administered medications for this encounter.     No current outpatient medications on file.       Disposition:  Home     DISCHARGE NOTE:   Pt has been reexamined. Patient has no new complaints, changes, or physical findings.  Care plan outlined and precautions discussed.  Results of workup were reviewed with the patient. All medications were reviewed with the patient. All of pt's questions and concerns were addressed. Patient was instructed and agrees to follow up with PCP as well as  to return to the ED upon further deterioration. Patient is ready to go home.           Medication List      You have not been prescribed any medications.             Diagnosis     Clinical Impression:   1. Skin wound closed with sterile strip           "Please note that this dictation was completed with Dragon, the computer voice recognition software. Quite often unanticipated grammatical, syntax, homophones, and other interpretive errors are inadvertently transcribed by the computer software. Please disregard these errors. Please excuse any errors that have escaped final proofreading."     Maryclare Labrador, [place]  01/27/22 1627       Riccardo Dubin [place], [place]  01/27/22 1652
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
## 2022-01-27 NOTE — ED Notes (Signed)
Discharge instructions reviewed with patient. Patient verbalized understanding.  Patient advised to follow up as directed on discharge instructions.  Patient denies questions, needs or concerns at this time. No s/sx of distress noted.        Gerome Sam, RN  01/27/22 [phone]
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
## 2024-04-20 ENCOUNTER — Emergency Department: Payer: Medicaid (Managed Care)

## 2024-04-20 ENCOUNTER — Inpatient Hospital Stay
Admit: 2024-04-20 | Discharge: 2024-04-21 | Disposition: A | Discharge status: Home / Self Care | Payer: Medicaid (Managed Care)

## 2024-04-20 ENCOUNTER — Emergency Department: Admit: 2024-04-20 | Payer: Medicaid (Managed Care)

## 2024-04-20 DIAGNOSIS — S39012A Strain of muscle, fascia and tendon of lower back, initial encounter: Secondary | ICD-10-CM

## 2024-04-20 DIAGNOSIS — T7421XA Adult sexual abuse, confirmed, initial encounter: Secondary | ICD-10-CM

## 2024-04-20 MED ORDER — LEVONORGESTREL 1.5 MG PO TABS
1.5 | ORAL | Status: DC
Start: 2024-04-20 — End: 2024-04-21

## 2024-04-20 MED ORDER — AZITHROMYCIN 250 MG PO TABS
250 | Freq: Once | ORAL | Status: DC
Start: 2024-04-20 — End: 2024-04-21

## 2024-04-20 MED ORDER — LIDOCAINE HCL 1% INJ (MIXTURES ONLY)
1 | INTRAMUSCULAR | Status: DC
Start: 2024-04-20 — End: 2024-04-21

## 2024-04-20 NOTE — ED Notes (Signed)
 Pt is on phone speaking to [facility] Nurse
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
## 2024-04-20 NOTE — ED Notes (Signed)
 Per [name] PA-C  hold off on collecting specimens as the SANE nurse is on her way to ED.
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
## 2024-04-20 NOTE — ED Notes (Signed)
 SANE nurse Kieran Pellet at bedside for exam.
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
## 2024-04-20 NOTE — ED Provider Notes (Cosign Needed)
 10:13 PM   Assumed care of the patient at this time. Discussed with [name] PA concerning patient Anita Marsh , standard discussion of reason for visit, HPI, ROS, PE, and current results available.   Planned disposition: home? (per SANE nurse recommendations)  Plan/Pending: CT scan of C-spine, head, lumbar spine, vaginal swabs for GC/chlamydia/trichomoniasis, STD prophylaxis and Plan B, all ordered and pending SANE nurse eval    3:01 AM   PROGRESS NOTE:   SANE nurse completed her eval, vaginal swabs are collected and sent to lab.  Patient declines any blood work and agreed to see SANE nurse in 4-5 weeks for f/u and STD re- screening including blood work.  Patient also declines empirical treatment for STDs as she is still has her trichomoniasis medication, will started, she will follow-up on labs done today and address any abnormals.  She declines Plan B as she is on Depo for birth control.  Patient will go for CTs as planned.  Patient remains in NAD, admits to being stressed due to current situation but denies SI, HI, hallucinations, or any plans to hurt self or others.  Patient, her sister, and her mother, all present here, understand and agree to plan.    3:01 AM   CT of the head, C-spine, L-spine are negative, patient can be discharged with the plan described above.  Discussed results, care in ED and further care, f/u and s/s warranting return to ED. Pt and family present understood and agreed to plan.      Labs Reviewed   CHLAMYDIA, GONORRHEA, TRICHOMONIASIS   PREGNANCY, URINE      CT HEAD WO CONTRAST   Final Result      No acute intracranial findings.      Electronically signed by Ana Kappa      CT CERVICAL SPINE WO CONTRAST   Final Result      CT cervical spine:   -No acute findings.      CT lumbar spine:   -No acute findings.                  Electronically signed by Ana Kappa      CT LUMBAR SPINE WO CONTRAST   Final Result      CT cervical spine:   -No acute findings.      CT  lumbar spine:   -No acute findings.                  Electronically signed by Ana Kappa      XR Forearm Right   Final Result   :   1. No acute abnormalities.      Electronically signed by Agustina Aldrich      XR WRIST RIGHT (MIN 3 VIEWS)   Final Result   1. No acute osseous abnormalities.      Electronically signed by Agustina Aldrich      XR ELBOW RIGHT (2 VIEWS)   Final Result   :   1. No acute osseous abnormalities.      Electronically signed by Agustina Aldrich         Medications - No data to display       Dictation disclaimer:  Please note that this dictation was completed with Dragon, the computer voice recognition software.  Quite often unanticipated grammatical, syntax, homophones, and other interpretive errors are inadvertently transcribed by the computer software.  Please disregard these errors.  Please excuse any errors that have escaped final proofreading.  Zeppelin Commisso, PA  04/21/24 0056       Kayhan Boardley, PA  04/21/24 4098
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
## 2024-04-20 NOTE — ED Notes (Signed)
 Shift change report given to [facility] CN RN
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
## 2024-04-21 ENCOUNTER — Inpatient Hospital Stay: Admit: 2024-04-21 | Payer: Medicaid (Managed Care)

## 2024-04-21 LAB — PREGNANCY, URINE: Pregnancy, Urine: NEGATIVE

## 2024-04-21 MED FILL — MY WAY 1.5 MG PO TABS: 1.5 MG | ORAL | Qty: 1 | Fill #0

## 2024-04-21 MED FILL — AZITHROMYCIN 250 MG PO TABS: 250 MG | ORAL | Qty: 4 | Fill #0

## 2024-04-21 MED FILL — CEFTRIAXONE SODIUM 500 MG IJ SOLR: 500 MG | INTRAMUSCULAR | Qty: 500 | Fill #0

## 2024-04-21 NOTE — ED Notes (Signed)
 Verbal shift change report given to Latarra Eagleton, Charity fundraiser (Cabin crew) by Janeice Medal, RN (offgoing nurse). Report included the following information Nurse Handoff Report, ED Encounter Summary, MAR, Recent Results, and Neuro Assessment.

## 2024-04-21 NOTE — ED Notes (Signed)
 Pt given a P&J sandwich and Ginger Ale.

## 2024-04-21 NOTE — ED Notes (Signed)
 Discharge instructions given, pt verbalized understanding. All questions answered. Pt ambulatory to Triage.

## 2024-04-21 NOTE — ED Notes (Signed)
 Pt taken to CT.

## 2024-04-25 LAB — CHLAMYDIA, GONORRHEA, TRICHOMONIASIS
Chlamydia trachomatis, NAA: NEGATIVE
Neisseria Gonorrhoeae, NAA: NEGATIVE
Trichomonas Vaginalis by NAA: NEGATIVE

## 2024-05-12 ENCOUNTER — Inpatient Hospital Stay: Admit: 2024-05-12 | Discharge: 2024-05-13 | Payer: Medicaid (Managed Care)

## 2024-05-12 DIAGNOSIS — R45851 Suicidal ideations: Secondary | ICD-10-CM

## 2024-05-12 LAB — BASIC METABOLIC PANEL
Anion Gap: 10 mmol/L (ref 3.0–18.0)
BUN/Creatinine Ratio: 12 (ref 12–20)
BUN: 12 mg/dL (ref 6–23)
CO2: 24 mmol/L (ref 21–32)
Calcium: 9.8 mg/dL (ref 8.5–10.1)
Chloride: 106 mmol/L (ref 98–107)
Creatinine: 0.93 mg/dL (ref 0.6–1.3)
Est, Glom Filt Rate: 87 mL/min/{1.73_m2} (ref >60)
Glucose: 127 mg/dL — ABNORMAL HIGH (ref 74–108)
Potassium: 4.3 mmol/L (ref 3.5–5.5)
Sodium: 140 mmol/L (ref 136–145)

## 2024-05-12 LAB — URINE DRUG SCREEN
Amphetamine, Urine: NEGATIVE
Barbiturates, Urine: NEGATIVE
Benzodiazepines, Urine: NEGATIVE
Cocaine, Urine: NEGATIVE
Fentanyl: NEGATIVE
Methadone, Urine: NEGATIVE
Opiates, Urine: NEGATIVE
Oxycodone Urine: NEGATIVE
THC, TH-Cannabinol, Urine: POSITIVE — AB

## 2024-05-12 LAB — CBC WITH AUTO DIFFERENTIAL
Basophils %: 0.8 % (ref 0.0–2.0)
Basophils Absolute: 0.04 10*3/uL (ref 0.00–0.10)
Eosinophils %: 4.5 % (ref 0.0–5.0)
Eosinophils Absolute: 0.22 10*3/uL (ref 0.00–0.40)
Hematocrit: 37 % (ref 35.0–45.0)
Hemoglobin: 10.9 g/dL — ABNORMAL LOW (ref 12.0–16.0)
Immature Granulocytes %: 0 % (ref 0.0–0.5)
Immature Granulocytes Absolute: 0 10*3/uL (ref 0.00–0.04)
Lymphocytes %: 36.7 % (ref 21.0–52.0)
Lymphocytes Absolute: 1.8 10*3/uL (ref 0.90–3.60)
MCH: 22.7 pg — ABNORMAL LOW (ref 24.0–34.0)
MCHC: 29.5 g/dL — ABNORMAL LOW (ref 31.0–37.0)
MCV: 77.1 FL — ABNORMAL LOW (ref 78.0–100.0)
MPV: 11.3 FL (ref 9.2–11.8)
Monocytes %: 11 % — ABNORMAL HIGH (ref 3.0–10.0)
Monocytes Absolute: 0.54 10*3/uL (ref 0.05–1.20)
Neutrophils %: 47 % (ref 40.0–73.0)
Neutrophils Absolute: 2.3 10*3/uL (ref 1.80–8.00)
Nucleated RBCs: 0 /100{WBCs}
Platelets: 339 10*3/uL (ref 135–420)
RBC: 4.8 M/uL (ref 4.20–5.30)
RDW: 20.1 % — ABNORMAL HIGH (ref 11.6–14.5)
WBC: 4.9 10*3/uL (ref 4.6–13.2)
nRBC: 0 10*3/uL (ref 0.00–0.01)

## 2024-05-12 LAB — HCG, SERUM, QUALITATIVE: Preg, Serum: NEGATIVE

## 2024-05-12 LAB — ETHANOL: Ethanol Lvl: <11 mg/dL

## 2024-05-12 NOTE — Other (Signed)
 Crisis Note: Crisis met with this patient regarding ED visit. Patient denies SI; however, telepsych is recommending inpatient psychiatric admission which patient would benefit from inpatient hospitalization in order to keep patient safe, stabilize her mood and get her connected to community services.  Patient is not receptive to inpatient psychiatric treatment. Crisis educated patient on the TDO process and infomred her that she will be evaluated by a [facility] clinician. Discussed case with the on-call psychiatrist, Dr. Arva Bio, who is requesting TDO evaluation. ED charge nurse and provider made aware

## 2024-05-12 NOTE — ED Notes (Signed)
Hand off report given to Elaine, RN
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
## 2024-05-12 NOTE — Other (Signed)
 Crisis Note: Clinical research associate spoke with Judie Noun with [facility], [phone], who reports patient is voluntary for inpatient treatment.
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
## 2024-05-12 NOTE — ED Notes (Signed)
 Patient has left the unit efforts to bring patient back to the treatment area have failed, will call non emergency to help facilitate return of this patient.

## 2024-05-12 NOTE — ED Notes (Signed)
 Telepsych #2 at bedside with pt.

## 2024-05-12 NOTE — ED Notes (Signed)
 Pt given sandwich, crackers, and drinks. Pt would like labs drawn after eating.

## 2024-05-12 NOTE — Other (Signed)
 Crisis Note: Patient speaking with Judie Noun with [facility] via telephone.
